# Patient Record
Sex: Male | Born: 1960 | Race: White | Hispanic: No | Marital: Married | Smoking: Former smoker
Health system: Southern US, Community
[De-identification: ages and names within clinical notes are randomized; demographics above are authoritative.]

## PROBLEM LIST (undated history)

## (undated) DIAGNOSIS — E119 Type 2 diabetes mellitus without complications: Secondary | ICD-10-CM

## (undated) DIAGNOSIS — Z720 Tobacco use: Secondary | ICD-10-CM

## (undated) DIAGNOSIS — E785 Hyperlipidemia, unspecified: Secondary | ICD-10-CM

## (undated) DIAGNOSIS — I10 Essential (primary) hypertension: Secondary | ICD-10-CM

## (undated) DIAGNOSIS — Z87442 Personal history of urinary calculi: Secondary | ICD-10-CM

## (undated) DIAGNOSIS — R918 Other nonspecific abnormal finding of lung field: Secondary | ICD-10-CM

## (undated) DIAGNOSIS — M1990 Unspecified osteoarthritis, unspecified site: Secondary | ICD-10-CM

## (undated) HISTORY — DX: Tobacco use: Z72.0

## (undated) HISTORY — DX: Type 2 diabetes mellitus without complications: E11.9

## (undated) HISTORY — DX: Hyperlipidemia, unspecified: E78.5

## (undated) HISTORY — DX: Other nonspecific abnormal finding of lung field: R91.8

## (undated) HISTORY — PX: KNEE CARTILAGE SURGERY: SHX688

## (undated) HISTORY — PX: ANKLE SURGERY: SHX546

## (undated) HISTORY — PX: BACK SURGERY: SHX140

---

## 1999-06-05 ENCOUNTER — Encounter: Payer: Self-pay | Admitting: *Deleted

## 1999-06-05 ENCOUNTER — Ambulatory Visit (HOSPITAL_COMMUNITY): Admission: RE | Admit: 1999-06-05 | Discharge: 1999-06-05 | Payer: Self-pay | Admitting: *Deleted

## 2002-06-12 ENCOUNTER — Encounter: Payer: Self-pay | Admitting: Neurosurgery

## 2002-06-14 ENCOUNTER — Ambulatory Visit (HOSPITAL_COMMUNITY): Admission: RE | Admit: 2002-06-14 | Discharge: 2002-06-14 | Payer: Self-pay | Admitting: Neurosurgery

## 2002-06-14 ENCOUNTER — Encounter: Payer: Self-pay | Admitting: Neurosurgery

## 2003-02-13 ENCOUNTER — Emergency Department (HOSPITAL_COMMUNITY): Admission: EM | Admit: 2003-02-13 | Discharge: 2003-02-13 | Payer: Self-pay | Admitting: Emergency Medicine

## 2013-01-11 DIAGNOSIS — M51369 Other intervertebral disc degeneration, lumbar region without mention of lumbar back pain or lower extremity pain: Secondary | ICD-10-CM | POA: Insufficient documentation

## 2013-01-11 DIAGNOSIS — M5136 Other intervertebral disc degeneration, lumbar region: Secondary | ICD-10-CM | POA: Insufficient documentation

## 2013-01-11 DIAGNOSIS — R7989 Other specified abnormal findings of blood chemistry: Secondary | ICD-10-CM | POA: Insufficient documentation

## 2013-11-12 DIAGNOSIS — Z1331 Encounter for screening for depression: Secondary | ICD-10-CM | POA: Insufficient documentation

## 2014-02-12 DIAGNOSIS — G5602 Carpal tunnel syndrome, left upper limb: Secondary | ICD-10-CM | POA: Insufficient documentation

## 2014-04-12 DIAGNOSIS — Z5320 Procedure and treatment not carried out because of patient's decision for unspecified reasons: Secondary | ICD-10-CM | POA: Insufficient documentation

## 2015-05-13 DIAGNOSIS — M19042 Primary osteoarthritis, left hand: Secondary | ICD-10-CM | POA: Insufficient documentation

## 2015-05-13 DIAGNOSIS — M19041 Primary osteoarthritis, right hand: Secondary | ICD-10-CM | POA: Insufficient documentation

## 2015-07-29 DIAGNOSIS — F32A Depression, unspecified: Secondary | ICD-10-CM | POA: Insufficient documentation

## 2016-02-16 DIAGNOSIS — E782 Mixed hyperlipidemia: Secondary | ICD-10-CM | POA: Insufficient documentation

## 2016-02-16 DIAGNOSIS — Z79899 Other long term (current) drug therapy: Secondary | ICD-10-CM | POA: Insufficient documentation

## 2016-07-19 DIAGNOSIS — S46002A Unspecified injury of muscle(s) and tendon(s) of the rotator cuff of left shoulder, initial encounter: Secondary | ICD-10-CM | POA: Insufficient documentation

## 2016-11-18 DIAGNOSIS — K760 Fatty (change of) liver, not elsewhere classified: Secondary | ICD-10-CM | POA: Insufficient documentation

## 2016-11-18 DIAGNOSIS — S2243XD Multiple fractures of ribs, bilateral, subsequent encounter for fracture with routine healing: Secondary | ICD-10-CM | POA: Insufficient documentation

## 2016-11-18 DIAGNOSIS — S2222XD Fracture of body of sternum, subsequent encounter for fracture with routine healing: Secondary | ICD-10-CM | POA: Insufficient documentation

## 2016-11-18 DIAGNOSIS — S46001A Unspecified injury of muscle(s) and tendon(s) of the rotator cuff of right shoulder, initial encounter: Secondary | ICD-10-CM | POA: Insufficient documentation

## 2016-12-09 DIAGNOSIS — G5692 Unspecified mononeuropathy of left upper limb: Secondary | ICD-10-CM | POA: Insufficient documentation

## 2017-11-07 DIAGNOSIS — E1169 Type 2 diabetes mellitus with other specified complication: Secondary | ICD-10-CM | POA: Insufficient documentation

## 2018-04-06 DIAGNOSIS — R3912 Poor urinary stream: Secondary | ICD-10-CM | POA: Insufficient documentation

## 2018-04-06 DIAGNOSIS — N401 Enlarged prostate with lower urinary tract symptoms: Secondary | ICD-10-CM | POA: Insufficient documentation

## 2018-12-14 DIAGNOSIS — M1712 Unilateral primary osteoarthritis, left knee: Secondary | ICD-10-CM | POA: Insufficient documentation

## 2020-08-02 DIAGNOSIS — R911 Solitary pulmonary nodule: Secondary | ICD-10-CM | POA: Insufficient documentation

## 2020-08-28 DIAGNOSIS — S68624A Partial traumatic transphalangeal amputation of right ring finger, initial encounter: Secondary | ICD-10-CM | POA: Insufficient documentation

## 2021-02-17 ENCOUNTER — Other Ambulatory Visit: Payer: Self-pay | Admitting: Surgery

## 2021-02-17 DIAGNOSIS — Z951 Presence of aortocoronary bypass graft: Secondary | ICD-10-CM

## 2021-03-05 ENCOUNTER — Institutional Professional Consult (permissible substitution): Payer: BC Managed Care – PPO | Admitting: Thoracic Surgery (Cardiothoracic Vascular Surgery)

## 2021-03-05 ENCOUNTER — Other Ambulatory Visit: Payer: Self-pay

## 2021-03-05 ENCOUNTER — Encounter: Payer: Self-pay | Admitting: Thoracic Surgery (Cardiothoracic Vascular Surgery)

## 2021-03-05 VITALS — BP 114/71 | HR 78 | Resp 20 | Ht 70.5 in | Wt 186.7 lb

## 2021-03-05 DIAGNOSIS — N486 Induration penis plastica: Secondary | ICD-10-CM | POA: Insufficient documentation

## 2021-03-05 DIAGNOSIS — Z720 Tobacco use: Secondary | ICD-10-CM | POA: Insufficient documentation

## 2021-03-05 DIAGNOSIS — R911 Solitary pulmonary nodule: Secondary | ICD-10-CM | POA: Diagnosis not present

## 2021-03-05 NOTE — H&P (View-Only) (Signed)
PCP is No primary care provider on file. Referring Provider is Delma Officer, - Pulmonology  Chief Complaint  Patient presents with   Lung Mass    Surgical consult, PET Scan 02/05/21, Chest CT 11/19/20    HPI: Bill [name] is sent for consultation regarding a right lower lobe lung nodule.  Bill [name] is a 60 year old man with history of tobacco abuse, chronic pain, multiple orthopedic injuries and surgeries, type 2 diabetes, hyperlipidemia, neuropathy, anxiety, and depression.  He is being evaluated for possible kidney stone back in the spring.  A CT showed a mixed density right lower lobe lung lesion.  It measured 3.6 x 3cm.  It was near the posterior right costophrenic sulcus.  Follow-up CT in May showed no change.  More recently he had a PET/CT which showed the nodule was hypermetabolic.  There was no evidence of regional or distant metastatic disease.  He is on gabapentin and methadone for pain.  He does have a pain contract with his primary care.  He had an open sternal fracture repaired in 2018.  He does still have some pain in that area.  He relates an episode about a month ago when he was mowing his grass when he had severe pain in his chest.  He also felt general malaise and extreme fatigue.  He attributed the pain to his sternal injury.  He has not had any further episodes of that nature.  He complains of dizzy spells if he bends over and then stands back up.  He denies shortness of breath, coughing, and wheezing.  Zubrod Score: At the time of surgery this patient's most appropriate activity status/level should be described as: []     0    Normal activity, no symptoms [x]     1    Restricted in physical strenuous activity but ambulatory, able to do out light work []     2    Ambulatory and capable of self care, unable to do work activities, up and about >50 % of waking hours                              []     3    Only limited self care, in bed greater than 50% of  waking hours []     4    Completely disabled, no self care, confined to bed or chair []     5    Moribund   Patient Active Problem List   Diagnosis Date Noted   Peyronie disease 03/05/2021   Tobacco use 03/05/2021   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   Lung nodule 08/02/2020   Primary osteoarthritis of left knee 12/14/2018   Benign prostatic hyperplasia with weak urinary stream 04/06/2018   Type 2 diabetes mellitus with hyperlipidemia ([place]) 11/07/2017   Neuropathy, arm, left 12/09/2016   Closed fracture of body of sternum with routine healing 11/18/2016   Hepatic steatosis 11/18/2016   Injury of right rotator cuff 11/18/2016   Multiple closed fractures of ribs of both sides with routine healing 11/18/2016   Injury of left rotator cuff 07/19/2016   Controlled substance agreement signed 02/16/2016   Mixed hyperlipidemia 02/16/2016   Anxiety and depression 07/29/2015   Arthritis of both hands 05/13/2015   Colonoscopy refused 04/12/2014   Carpal tunnel syndrome of left wrist 02/12/2014   Screening for depression 11/12/2013   DDD (degenerative disc disease), lumbar 01/11/2013  Low testosterone 01/11/2013     History reviewed. No pertinent surgical history.  History reviewed. No pertinent family history.  Social History Social History   Tobacco Use   Smoking status: Every Day    Packs/day: 0.50    Types: Cigarettes   Smokeless tobacco: Never    Current Outpatient Medications  Medication Sig Dispense Refill   aspirin 81 MG EC tablet Take by mouth.     atorvastatin (LIPITOR) 40 MG tablet Take 1 tablet by mouth daily.     azelastine (ASTELIN) 0.1 % nasal spray 1-2 sprays into each nostril two (2) times a day as needed. Use in each nostril as directed     DULoxetine (CYMBALTA) 30 MG capsule Take by mouth.     gabapentin (NEURONTIN) 400 MG capsule Take by mouth.     lisinopril (ZESTRIL) 10 MG tablet Take by mouth.     Melatonin 5 MG  CAPS Take one capsule by mouth once nightly     meloxicam (MOBIC) 15 MG tablet Take 1 tablet by mouth daily.     metFORMIN (GLUCOPHAGE-XR) 500 MG 24 hr tablet Take by mouth.     methadone (DOLOPHINE) 10 MG tablet Take by mouth.     tadalafil (CIALIS) 5 MG tablet Take 1 tablet by mouth daily.     traMADol (ULTRAM) 50 MG tablet Take 1-2 tablets by mouth every 6 (six) hours as needed.     No current facility-administered medications for this visit.    Allergies  Allergen Reactions   Penicillins Other (See Comments)    Unknown but was told as child has reaction    Review of Systems  Constitutional:  Positive for fatigue. Negative for activity change.  HENT:  Negative for trouble swallowing and voice change.   Eyes:  Negative for visual disturbance.  Respiratory:  Negative for cough, shortness of breath and wheezing.   Cardiovascular:  Positive for chest pain.  Musculoskeletal:  Positive for arthralgias, back pain and joint swelling.  Neurological:  Positive for dizziness and numbness.  All other systems reviewed and are negative.  BP 114/71 (BP Location: Left Arm, Patient Position: Sitting, Cuff Size: Normal)   Pulse 78   Resp 20   Ht 5' 10.5" (1.791 m)   Wt 186 lb 11.2 oz (84.7 kg)   SpO2 99% Comment: RA  BMI 26.41 kg/m  Physical Exam Vitals reviewed.  Constitutional:      General: He is not in acute distress.    Appearance: Normal appearance.  HENT:     Head: Normocephalic and atraumatic.  Eyes:     General: No scleral icterus.    Extraocular Movements: Extraocular movements intact.  Cardiovascular:     Rate and Rhythm: Normal rate and regular rhythm.     Heart sounds: Normal heart sounds. No murmur heard. Pulmonary:     Effort: Pulmonary effort is normal. No respiratory distress.     Breath sounds: Normal breath sounds. No wheezing or rales.  Abdominal:     General: There is no distension.     Palpations: Abdomen is soft.     Tenderness: There is no abdominal  tenderness.  Skin:    General: Skin is warm and dry.  Neurological:     General: No focal deficit present.     Mental Status: He is oriented to person, place, and time.     Cranial Nerves: No cranial nerve deficit.     Motor: No weakness.    Diagnostic Tests: I personally reviewed  the CT and PET/CT images.  The patient brought with him on a disc.  There is a 3.4 x 3 cm mixed density lesion in the inferior basilar posterior right lower lobe.  On PET/CT there is hypermetabolism.  There is no evidence of regional or distant metastatic disease.  There is severe coronary calcification.  Pulmonary function testing per Dr. Georgetta Haber office note FVC 101.6% FEV1 112% DLCO 100%  Impression: Issaac Shipper is a 60 year old man from [facility] with a history of tobacco abuse, chronic pain, multiple orthopedic injuries and surgeries, type 2 diabetes, hyperlipidemia, neuropathy, kidney stones, anxiety, and depression.    He was incidentally found to have a right lower lobe lung nodule on a CT done to evaluate for kidney stones back in February.  In May the nodule was persistent.  He then had a PET/CT on 02/05/2021 which showed the nodule was hypermetabolic.  Findings are concerning for a new primary bronchogenic carcinoma.  Infectious and inflammatory nodules are also in the differential, but this has to be considered a lung cancer unless proven otherwise.  Clinically this would be a stage Ia (T1, N0 lesion)  We discussed the options of attempting to do a bronchoscopic or needle biopsy.  Unfortunately with the location near the diaphragm with either those being negative, I would not trust that result and would recommend surgical resection regardless.  Obviously, if positive he would need surgical resection.  I recommended we proceed directly to robotic right VATS for wedge resection for definitive diagnosis and possible lower lobectomy if it is in fact cancerous.  I discussed the general nature of the  procedure including the need for general anesthesia, the incisions to be used, the intraoperative decision-making, the use of a drainage tube postoperatively, the expected hospital stay, and the overall recovery with Mr. and Mrs. Nilsen.  I informed them of the indications, risks, benefits, and alternatives.  They understand the risks include, but not limited to death, MI, DVT, PE, bleeding, possible need for transfusion, infection, prolonged air leak, cardiac arrhythmias, as well as possibility of other unforeseeable complications.  He understands and accepts the risks and wishes to proceed with surgical resection for definitive diagnosis and treatment.  I am a little concerned about his cardiac status.  He has extensive coronary calcification on his CT and PET/CT images.  He had this episode about a month ago while mowing the yard.  Initially sound like it might just be musculoskeletal, but on further questioning he had some additional symptoms including extreme fatigue and malaise that are concerning for possible cardiac event.  I think he should be evaluated by cardiology prior to undergoing a major operation.  Tobacco abuse he says he smoked about half a pack a day for 40 years.  He is still smoking.  He says he has quit several times.  Emphasized the importance of complete cessation.  Chronic pain syndrome-chronically on gabapentin and methadone.  He has a Soil scientist with his primary.  The plan would be we would manage his pain while in the hospital and then defer to his primary for outpatient pain management.  Plan: Obtain full pulmonary function test results Cardiology evaluation for preoperative clearance Once cleared by cardiology plan for robotic right VATS for wedge resection and possible right lower lobectomy.  [name], MD [facility] [phone]

## 2021-03-05 NOTE — Progress Notes (Signed)
PCP is No primary care provider on file. Referring Provider is Delma Officer, - Pulmonology  Chief Complaint  Patient presents with   Lung Mass    Surgical consult, PET Scan 02/05/21, Chest CT 11/19/20    HPI: Bill [name] is sent for consultation regarding a right lower lobe lung nodule.  Bill [name] is a 60 year old man with history of tobacco abuse, chronic pain, multiple orthopedic injuries and surgeries, type 2 diabetes, hyperlipidemia, neuropathy, anxiety, and depression.  He is being evaluated for possible kidney stone back in the spring.  A CT showed a mixed density right lower lobe lung lesion.  It measured 3.6 x 3cm.  It was near the posterior right costophrenic sulcus.  Follow-up CT in May showed no change.  More recently he had a PET/CT which showed the nodule was hypermetabolic.  There was no evidence of regional or distant metastatic disease.  He is on gabapentin and methadone for pain.  He does have a pain contract with his primary care.  He had an open sternal fracture repaired in 2018.  He does still have some pain in that area.  He relates an episode about a month ago when he was mowing his grass when he had severe pain in his chest.  He also felt general malaise and extreme fatigue.  He attributed the pain to his sternal injury.  He has not had any further episodes of that nature.  He complains of dizzy spells if he bends over and then stands back up.  He denies shortness of breath, coughing, and wheezing.  Zubrod Score: At the time of surgery this patient's most appropriate activity status/level should be described as: []     0    Normal activity, no symptoms [x]     1    Restricted in physical strenuous activity but ambulatory, able to do out light work []     2    Ambulatory and capable of self care, unable to do work activities, up and about >50 % of waking hours                              []     3    Only limited self care, in bed greater than 50% of  waking hours []     4    Completely disabled, no self care, confined to bed or chair []     5    Moribund   Patient Active Problem List   Diagnosis Date Noted   Peyronie disease 03/05/2021   Tobacco use 03/05/2021   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   Lung nodule 08/02/2020   Primary osteoarthritis of left knee 12/14/2018   Benign prostatic hyperplasia with weak urinary stream 04/06/2018   Type 2 diabetes mellitus with hyperlipidemia ([place]) 11/07/2017   Neuropathy, arm, left 12/09/2016   Closed fracture of body of sternum with routine healing 11/18/2016   Hepatic steatosis 11/18/2016   Injury of right rotator cuff 11/18/2016   Multiple closed fractures of ribs of both sides with routine healing 11/18/2016   Injury of left rotator cuff 07/19/2016   Controlled substance agreement signed 02/16/2016   Mixed hyperlipidemia 02/16/2016   Anxiety and depression 07/29/2015   Arthritis of both hands 05/13/2015   Colonoscopy refused 04/12/2014   Carpal tunnel syndrome of left wrist 02/12/2014   Screening for depression 11/12/2013   DDD (degenerative disc disease), lumbar 01/11/2013  Low testosterone 01/11/2013     History reviewed. No pertinent surgical history.  History reviewed. No pertinent family history.  Social History Social History   Tobacco Use   Smoking status: Every Day    Packs/day: 0.50    Types: Cigarettes   Smokeless tobacco: Never    Current Outpatient Medications  Medication Sig Dispense Refill   aspirin 81 MG EC tablet Take by mouth.     atorvastatin (LIPITOR) 40 MG tablet Take 1 tablet by mouth daily.     azelastine (ASTELIN) 0.1 % nasal spray 1-2 sprays into each nostril two (2) times a day as needed. Use in each nostril as directed     DULoxetine (CYMBALTA) 30 MG capsule Take by mouth.     gabapentin (NEURONTIN) 400 MG capsule Take by mouth.     lisinopril (ZESTRIL) 10 MG tablet Take by mouth.     Melatonin 5 MG  CAPS Take one capsule by mouth once nightly     meloxicam (MOBIC) 15 MG tablet Take 1 tablet by mouth daily.     metFORMIN (GLUCOPHAGE-XR) 500 MG 24 hr tablet Take by mouth.     methadone (DOLOPHINE) 10 MG tablet Take by mouth.     tadalafil (CIALIS) 5 MG tablet Take 1 tablet by mouth daily.     traMADol (ULTRAM) 50 MG tablet Take 1-2 tablets by mouth every 6 (six) hours as needed.     No current facility-administered medications for this visit.    Allergies  Allergen Reactions   Penicillins Other (See Comments)    Unknown but was told as child has reaction    Review of Systems  Constitutional:  Positive for fatigue. Negative for activity change.  HENT:  Negative for trouble swallowing and voice change.   Eyes:  Negative for visual disturbance.  Respiratory:  Negative for cough, shortness of breath and wheezing.   Cardiovascular:  Positive for chest pain.  Musculoskeletal:  Positive for arthralgias, back pain and joint swelling.  Neurological:  Positive for dizziness and numbness.  All other systems reviewed and are negative.  BP 114/71 (BP Location: Left Arm, Patient Position: Sitting, Cuff Size: Normal)   Pulse 78   Resp 20   Ht 5' 10.5" (1.791 m)   Wt 186 lb 11.2 oz (84.7 kg)   SpO2 99% Comment: RA  BMI 26.41 kg/m  Physical Exam Vitals reviewed.  Constitutional:      General: He is not in acute distress.    Appearance: Normal appearance.  HENT:     Head: Normocephalic and atraumatic.  Eyes:     General: No scleral icterus.    Extraocular Movements: Extraocular movements intact.  Cardiovascular:     Rate and Rhythm: Normal rate and regular rhythm.     Heart sounds: Normal heart sounds. No murmur heard. Pulmonary:     Effort: Pulmonary effort is normal. No respiratory distress.     Breath sounds: Normal breath sounds. No wheezing or rales.  Abdominal:     General: There is no distension.     Palpations: Abdomen is soft.     Tenderness: There is no abdominal  tenderness.  Skin:    General: Skin is warm and dry.  Neurological:     General: No focal deficit present.     Mental Status: He is oriented to person, place, and time.     Cranial Nerves: No cranial nerve deficit.     Motor: No weakness.    Diagnostic Tests: I personally reviewed  the CT and PET/CT images.  The patient brought with him on a disc.  There is a 3.4 x 3 cm mixed density lesion in the inferior basilar posterior right lower lobe.  On PET/CT there is hypermetabolism.  There is no evidence of regional or distant metastatic disease.  There is severe coronary calcification.  Pulmonary function testing per Dr. Georgetta Haber office note FVC 101.6% FEV1 112% DLCO 100%  Impression: Bill [name] is a 60 year old man from [facility] with a history of tobacco abuse, chronic pain, multiple orthopedic injuries and surgeries, type 2 diabetes, hyperlipidemia, neuropathy, kidney stones, anxiety, and depression.    He was incidentally found to have a right lower lobe lung nodule on a CT done to evaluate for kidney stones back in February.  In May the nodule was persistent.  He then had a PET/CT on 02/05/2021 which showed the nodule was hypermetabolic.  Findings are concerning for a new primary bronchogenic carcinoma.  Infectious and inflammatory nodules are also in the differential, but this has to be considered a lung cancer unless proven otherwise.  Clinically this would be a stage Ia (T1, N0 lesion)  We discussed the options of attempting to do a bronchoscopic or needle biopsy.  Unfortunately with the location near the diaphragm with either those being negative, I would not trust that result and would recommend surgical resection regardless.  Obviously, if positive he would need surgical resection.  I recommended we proceed directly to robotic right VATS for wedge resection for definitive diagnosis and possible lower lobectomy if it is in fact cancerous.  I discussed the general nature of the  procedure including the need for general anesthesia, the incisions to be used, the intraoperative decision-making, the use of a drainage tube postoperatively, the expected hospital stay, and the overall recovery with Mr. and Mrs. Dresser.  I informed them of the indications, risks, benefits, and alternatives.  They understand the risks include, but not limited to death, MI, DVT, PE, bleeding, possible need for transfusion, infection, prolonged air leak, cardiac arrhythmias, as well as possibility of other unforeseeable complications.  He understands and accepts the risks and wishes to proceed with surgical resection for definitive diagnosis and treatment.  I am a little concerned about his cardiac status.  He has extensive coronary calcification on his CT and PET/CT images.  He had this episode about a month ago while mowing the yard.  Initially sound like it might just be musculoskeletal, but on further questioning he had some additional symptoms including extreme fatigue and malaise that are concerning for possible cardiac event.  I think he should be evaluated by cardiology prior to undergoing a major operation.  Tobacco abuse he says he smoked about half a pack a day for 40 years.  He is still smoking.  He says he has quit several times.  Emphasized the importance of complete cessation.  Chronic pain syndrome-chronically on gabapentin and methadone.  He has a Soil scientist with his primary.  The plan would be we would manage his pain while in the hospital and then defer to his primary for outpatient pain management.  Plan: Obtain full pulmonary function test results Cardiology evaluation for preoperative clearance Once cleared by cardiology plan for robotic right VATS for wedge resection and possible right lower lobectomy.  [name], MD [facility] [phone]

## 2021-03-06 ENCOUNTER — Other Ambulatory Visit: Payer: Self-pay | Admitting: Thoracic Surgery (Cardiothoracic Vascular Surgery)

## 2021-03-06 DIAGNOSIS — R911 Solitary pulmonary nodule: Secondary | ICD-10-CM

## 2021-03-10 LAB — PULMONARY FUNCTION TEST

## 2021-03-11 ENCOUNTER — Ambulatory Visit: Payer: BC Managed Care – PPO | Admitting: Cardiovascular Disease

## 2021-03-11 ENCOUNTER — Other Ambulatory Visit: Payer: Self-pay

## 2021-03-11 ENCOUNTER — Encounter: Payer: Self-pay | Admitting: *Deleted

## 2021-03-11 ENCOUNTER — Encounter: Payer: Self-pay | Admitting: Cardiovascular Disease

## 2021-03-11 VITALS — BP 118/70 | HR 73 | Ht 70.5 in | Wt 187.6 lb

## 2021-03-11 DIAGNOSIS — Z01818 Encounter for other preprocedural examination: Secondary | ICD-10-CM

## 2021-03-11 DIAGNOSIS — R079 Chest pain, unspecified: Secondary | ICD-10-CM | POA: Diagnosis not present

## 2021-03-11 NOTE — Patient Instructions (Signed)
Medication Instructions:  No changes *If you need a refill on your cardiac medications before your next appointment, please call your pharmacy*   Lab Work: none If you have labs (blood work) drawn today and your tests are completely normal, you will receive your results only by: [place] (if you have MyChart) OR A paper copy in the mail If you have any lab test that is abnormal or we need to change your treatment, we will call you to review the results.   Testing/Procedures: Your physician has requested that you have an echocardiogram. Echocardiography is a painless test that uses sound waves to create images of your heart. It provides your doctor with information about the size and shape of your heart and how well your heart's chambers and valves are working. This procedure takes approximately one hour. There are no restrictions for this procedure.  Your physician has requested that you have en exercise stress myoview. For further information please visit [email or web address]. Please follow instruction sheet, as given.   Follow-Up: Follow up with your physician will depend on test results.

## 2021-03-11 NOTE — Progress Notes (Signed)
Chief Complaint  Patient presents with   New Patient (Initial Visit)    Chest pain    History of Present Illness: 60 yo male with history of tobacco abuse, chronic pain, DM, HLD, anxiety, neuropathy and a new lung mass who is here today for cardiac evaluation prior to resection of his lung mass. There is extensive coronary artery calcification on his chest CT. He is a long term smoker. New right lower lobe lung mass. He has seen Dr. Roxan Hockey with CT surgery and is referred to our office today for cardiac risk assessment. He had one episode of chest pain while he was pushing a lawnmower. He has a sternal plate. No dyspnea or LE edema. He does have dizziness when he bends down.   Primary Care Physician: [facility])  Past Medical History:  Diagnosis Date   Diabetes mellitus ([place])    Hyperlipidemia    Lung mass    Tobacco abuse     Past Surgical History:  Procedure Laterality Date   ANKLE SURGERY     BACK SURGERY     KNEE CARTILAGE SURGERY      Current Outpatient Medications  Medication Sig Dispense Refill   aspirin 81 MG EC tablet Take by mouth.     atorvastatin (LIPITOR) 40 MG tablet Take 1 tablet by mouth daily.     DULoxetine (CYMBALTA) 30 MG capsule Take by mouth.     gabapentin (NEURONTIN) 400 MG capsule Take by mouth.     lisinopril (ZESTRIL) 10 MG tablet Take by mouth.     Melatonin 5 MG CAPS Take one capsule by mouth once nightly     meloxicam (MOBIC) 15 MG tablet Take 1 tablet by mouth daily.     metFORMIN (GLUCOPHAGE-XR) 500 MG 24 hr tablet Take by mouth.     methadone (DOLOPHINE) 10 MG tablet Take by mouth.     tadalafil (CIALIS) 5 MG tablet Take 1 tablet by mouth daily.     traMADol (ULTRAM) 50 MG tablet Take 1-2 tablets by mouth every 6 (six) hours as needed.     azelastine (ASTELIN) 0.1 % nasal spray 1-2 sprays into each nostril two (2) times a day as needed. Use in each nostril as directed (Patient not taking: Reported on 03/11/2021)     No current  facility-administered medications for this visit.    Allergies  Allergen Reactions   Penicillins Other (See Comments)    Unknown but was told as child has reaction    Social History   Socioeconomic History   Marital status: Married    Spouse name: Not on file   Number of children: 2   Years of education: Not on file   Highest education level: Not on file  Occupational History   Occupation: Metal fabricator  Tobacco Use   Smoking status: Every Day    Packs/day: 0.50    Types: Cigarettes   Smokeless tobacco: Never  Substance and Sexual Activity   Alcohol use: Not Currently   Drug use: Never   Sexual activity: Not on file  Other Topics Concern   Not on file  Social History Narrative   Not on file   Social Determinants of Health   Financial Resource Strain: Not on file  Food Insecurity: Not on file  Transportation Needs: Not on file  Physical Activity: Not on file  Stress: Not on file  Social Connections: Not on file  Intimate Partner Violence: Not on file    Family History  Problem Relation Age of Onset   CVA Mother    Pneumonia Father    Alzheimer's disease Father     Review of Systems:  As stated in the HPI and otherwise negative.   BP 118/70   Pulse 73   Ht 5' 10.5" (1.791 m)   Wt 187 lb 9.6 oz (85.1 kg)   SpO2 98%   BMI 26.54 kg/m   Physical Examination: General: Well developed, well nourished, NAD  HEENT: OP clear, mucus membranes moist  SKIN: warm, dry. No rashes. Neuro: No focal deficits  Musculoskeletal: Muscle strength 5/5 all ext  Psychiatric: Mood and affect normal  Neck: No JVD, no carotid bruits, no thyromegaly, no lymphadenopathy.  Lungs:Clear bilaterally, no wheezes, rhonci, crackles Cardiovascular: Regular rate and rhythm. No murmurs, gallops or rubs. Abdomen:Soft. Bowel sounds present. Non-tender.  Extremities: No lower extremity edema. Pulses are 2 + in the bilateral DP/PT.  EKG:  EKG is ordered today. The ekg ordered today  demonstrates Sinus  Recent Labs: No results found for requested labs within last 8760 hours.   Lipid Panel No results found for: CHOL, TRIG, HDL, CHOLHDL, VLDL, LDLCALC, LDLDIRECT   Wt Readings from Last 3 Encounters:  03/11/21 187 lb 9.6 oz (85.1 kg)  03/05/21 186 lb 11.2 oz (84.7 kg)     Assessment and Plan:   1. Pre-operative cardiovascular examination: He has an upcoming thoracic surgery for lung mass resection. He has had one episode of chest pain while mowing grass that he attributes to his chronic chest wall pain. Overall he is very functional and is not limited in any way by chest pain or dyspnea. Risk factors for CAD include tobacco abuse, hyperlipidemia and DM.  -I will arrange an exercise nuclear stress test and an echo -Further plans to follow after cardiac testing. No murmurs on exam so if LVEF normal on nuclear study, his echo could be done after his lung resection.   Current medicines are reviewed at length with the patient today.  The patient does not have concerns regarding medicines.  The following changes have been made:  no change  Labs/ tests ordered today include:   Orders Placed This Encounter  Procedures   Cardiac Stress Test: Informed Consent Details: Physician/Practitioner Attestation; Transcribe to consent form and obtain patient signature   MYOCARDIAL PERFUSION IMAGING   EKG 12-Lead   ECHOCARDIOGRAM COMPLETE      Disposition:   F/U with me in 6 months    Signed, Lauree Chandler, MD 03/11/2021 4:07 PM    [facility] [address], [place], [place]  [address] Phone: [phone]; Fax: [phone]

## 2021-03-12 ENCOUNTER — Telehealth (HOSPITAL_COMMUNITY): Payer: Self-pay | Admitting: *Deleted

## 2021-03-12 NOTE — Telephone Encounter (Signed)
Patient's wife given detailed instructions per Myocardial Perfusion Study Information Sheet for the test on 03/16/21 at 7:45. Patient notified to arrive 15 minutes early and that it is imperative to arrive on time for appointment to keep from having the test rescheduled.  If you need to cancel or reschedule your appointment, please call the office within 24 hours of your appointment. . Patient's wife verbalized understanding.Veronia Beets

## 2021-03-16 ENCOUNTER — Ambulatory Visit (HOSPITAL_COMMUNITY): Payer: BC Managed Care – PPO | Attending: Cardiovascular Disease

## 2021-03-16 ENCOUNTER — Other Ambulatory Visit: Payer: Self-pay

## 2021-03-16 DIAGNOSIS — Z01810 Encounter for preprocedural cardiovascular examination: Secondary | ICD-10-CM

## 2021-03-16 DIAGNOSIS — Z01818 Encounter for other preprocedural examination: Secondary | ICD-10-CM | POA: Insufficient documentation

## 2021-03-16 DIAGNOSIS — R079 Chest pain, unspecified: Secondary | ICD-10-CM | POA: Insufficient documentation

## 2021-03-16 LAB — MYOCARDIAL PERFUSION IMAGING
Angina Index: 0
Duke Treadmill Score: 10
Estimated workload: 10.9
Exercise duration (min): 9 min
Exercise duration (sec): 30 s
LV dias vol: 96 mL (ref 62–150)
LV sys vol: 36 mL
MPHR: 160 {beats}/min
Nuc Stress EF: 63 %
Peak HR: 144 {beats}/min
Percent HR: 90 %
Rest HR: 83 {beats}/min
Rest Nuclear Isotope Dose: 10.1 mCi
SDS: 0
SRS: 0
SSS: 0
ST Depression (mm): 0 mm
Stress Nuclear Isotope Dose: 32.4 mCi
TID: 0.92

## 2021-03-16 LAB — PULMONARY FUNCTION TEST

## 2021-03-16 MED ORDER — TECHNETIUM TC 99M TETROFOSMIN IV KIT
10.1000 | PACK | Freq: Once | INTRAVENOUS | Status: AC | PRN
  Administered 2021-03-16: 10.1 via INTRAVENOUS
  Filled 2021-03-16: qty 11

## 2021-03-16 MED ORDER — TECHNETIUM TC 99M TETROFOSMIN IV KIT
32.4000 | PACK | Freq: Once | INTRAVENOUS | Status: AC | PRN
  Administered 2021-03-16: 32.4 via INTRAVENOUS
  Filled 2021-03-16: qty 33

## 2021-03-18 ENCOUNTER — Telehealth: Payer: Self-pay | Admitting: *Deleted

## 2021-03-18 ENCOUNTER — Other Ambulatory Visit: Payer: Self-pay | Admitting: *Deleted

## 2021-03-18 ENCOUNTER — Encounter: Payer: Self-pay | Admitting: *Deleted

## 2021-03-18 DIAGNOSIS — R911 Solitary pulmonary nodule: Secondary | ICD-10-CM

## 2021-03-18 NOTE — Telephone Encounter (Signed)
-----   Message from Nuala Alpha, LPN sent at 8/33/3832 10:18 AM EDT -----  ----- Message ----- From: Damian Leavell, RN Sent: 03/18/2021  10:12 AM EDT To: Rebeca Alert [facility] Triage   ----- Message ----- From: Laury Deep, RN Sent: 03/16/2021   2:03 PM EDT To: Camelia Phenes, #  This patient needs full PFTs per Dr. Roxan Hockey.  No surgery date yet but can someone schedule PFTS please?  Maybe same day as his Echo (9/30??)  Also he will need a COVID swab pre-op but since he lives in [place], [place] can COVID him (their number to schedule is [phone]).  Thanks, [employer]

## 2021-03-18 NOTE — Telephone Encounter (Signed)
PFTs from June 2022 in chart reviewed by Dr. Roxan Hockey on 9/12.

## 2021-03-27 ENCOUNTER — Ambulatory Visit (HOSPITAL_COMMUNITY): Payer: BC Managed Care – PPO | Attending: Cardiology

## 2021-03-27 ENCOUNTER — Other Ambulatory Visit: Payer: Self-pay

## 2021-03-27 DIAGNOSIS — Z01818 Encounter for other preprocedural examination: Secondary | ICD-10-CM | POA: Diagnosis present

## 2021-03-27 DIAGNOSIS — R079 Chest pain, unspecified: Secondary | ICD-10-CM | POA: Diagnosis not present

## 2021-03-27 DIAGNOSIS — Z01810 Encounter for preprocedural cardiovascular examination: Secondary | ICD-10-CM | POA: Diagnosis not present

## 2021-03-27 NOTE — Progress Notes (Signed)
Surgical Instructions    Your procedure is scheduled on Wednesday, October 5th, 2022.   Report to [facility] Main Entrance "A" at 06:30 A.M., then check in with the Admitting office.  Call this number if you have problems the morning of surgery:  [phone]   If you have any questions prior to your surgery date call [phone]: Open Monday-Friday 8am-4pm    Remember:  Do not eat or drink after midnight the night before your surgery    Take these medicines the morning of surgery with A SIP OF WATER:  atorvastatin (LIPITOR)  DULoxetine (CYMBALTA) gabapentin (NEURONTIN)  methadone (DOLOPHINE)   If needed:  traMADol (ULTRAM)   Follow your surgeon's instructions on when to stop Aspirin.  If no instructions were given by your surgeon then you will need to call the office to get those instructions.     As of today, STOP taking any Aspirin (unless otherwise instructed by your surgeon) Aleve, Naproxen, Ibuprofen, Motrin, Advil, Goody's, BC's, all herbal medications, fish oil, and all vitamins.           WHAT DO I DO ABOUT MY DIABETES MEDICATION?   Do not metFORMIN (GLUCOPHAGE-XR)  the morning of surgery.   HOW TO MANAGE YOUR DIABETES BEFORE AND AFTER SURGERY  Why is it important to control my blood sugar before and after surgery? Improving blood sugar levels before and after surgery helps healing and can limit problems. A way of improving blood sugar control is eating a healthy diet by:  Eating less sugar and carbohydrates  Increasing activity/exercise  Talking with your doctor about reaching your blood sugar goals High blood sugars (greater than 180 mg/dL) can raise your risk of infections and slow your recovery, so you will need to focus on controlling your diabetes during the weeks before surgery. Make sure that the doctor who takes care of your diabetes knows about your planned surgery including the date and location.  How do I manage my blood sugar before  surgery? Check your blood sugar at least 4 times a day, starting 2 days before surgery, to make sure that the level is not too high or low.  Check your blood sugar the morning of your surgery when you wake up and every 2 hours until you get to the Short Stay unit.  If your blood sugar is less than 70 mg/dL, you will need to treat for low blood sugar: Do not take insulin. Treat a low blood sugar (less than 70 mg/dL) with  cup of clear juice (cranberry or apple), 4 glucose tablets, OR glucose gel. Recheck blood sugar in 15 minutes after treatment (to make sure it is greater than 70 mg/dL). If your blood sugar is not greater than 70 mg/dL on recheck, call [phone] for further instructions. Report your blood sugar to the short stay nurse when you get to Short Stay.  If you are admitted to the hospital after surgery: Your blood sugar will be checked by the staff and you will probably be given insulin after surgery (instead of oral diabetes medicines) to make sure you have good blood sugar levels. The goal for blood sugar control after surgery is 80-180 mg/dL.    After your COVID test   You are not required to quarantine however you are required to wear a well-fitting mask when you are out and around people not in your household.  If your mask becomes wet or soiled, replace with a new one.  Wash your hands often with soap  and water for 20 seconds or clean your hands with an alcohol-based hand sanitizer that contains at least 60% alcohol.  Do not share personal items.  Notify your provider: if you are in close contact with someone who has COVID  or if you develop a fever of 100.4 or greater, sneezing, cough, sore throat, shortness of breath or body aches.   Do not wear jewelry  Do not wear lotions, powders, colognes, or deodorant. Men may shave face and neck. Do not bring valuables to the hospital.                 [facility] is not responsible for any belongings or valuables.  Do  NOT Smoke (Tobacco/Vaping)  24 hours prior to your procedure If you use a CPAP at night, you may bring your mask for your overnight stay.   Contacts, glasses, dentures or bridgework may not be worn into surgery, please bring cases for these belongings   For patients admitted to the hospital, discharge time will be determined by your treatment team.   Patients discharged the day of surgery will not be allowed to drive home, and someone needs to stay with them for 24 hours.  NO VISITORS WILL BE ALLOWED IN PRE-OP WHERE PATIENTS ARE PREPPED FOR SURGERY.  ONLY 1 SUPPORT PERSON MAY BE PRESENT IN THE WAITING ROOM WHILE YOU ARE IN SURGERY.  IF YOU ARE TO BE ADMITTED, ONCE YOU ARE IN YOUR ROOM YOU WILL BE ALLOWED TWO (2) VISITORS. 1 (ONE) VISITOR MAY STAY OVERNIGHT BUT MUST ARRIVE TO THE ROOM BY 8pm.  Minor children may have two parents present. Special consideration for safety and communication needs will be reviewed on a case by case basis.  Special instructions:    Oral Hygiene is also important to reduce your risk of infection.  Remember - BRUSH YOUR TEETH THE MORNING OF SURGERY WITH YOUR REGULAR TOOTHPASTE   [facility]- Preparing For Surgery  Before surgery, you can play an important role. Because skin is not sterile, your skin needs to be as free of germs as possible. You can reduce the number of germs on your skin by washing with CHG (chlorahexidine gluconate) Soap before surgery.  CHG is an antiseptic cleaner which kills germs and bonds with the skin to continue killing germs even after washing.     Please do not use if you have an allergy to CHG or antibacterial soaps. If your skin becomes reddened/irritated stop using the CHG.  Do not shave (including legs and underarms) for at least 48 hours prior to first CHG shower. It is OK to shave your face.  Please follow these instructions carefully.     Shower the NIGHT BEFORE SURGERY and the MORNING OF SURGERY with CHG Soap.   If you chose to  wash your hair, wash your hair first as usual with your normal shampoo. After you shampoo, rinse your hair and body thoroughly to remove the shampoo.  Then [employer] and genitals (private parts) with your normal soap and rinse thoroughly to remove soap.  After that Use CHG Soap as you would any other liquid soap. You can apply CHG directly to the skin and wash gently with a scrungie or a clean washcloth.   Apply the CHG Soap to your body ONLY FROM THE NECK DOWN.  Do not use on open wounds or open sores. Avoid contact with your eyes, ears, mouth and genitals (private parts). Wash Face and genitals (private parts)  with your normal  soap.   Wash thoroughly, paying special attention to the area where your surgery will be performed.  Thoroughly rinse your body with warm water from the neck down.  DO NOT shower/wash with your normal soap after using and rinsing off the CHG Soap.  Pat yourself dry with a CLEAN TOWEL.  Wear CLEAN PAJAMAS to bed the night before surgery  Place CLEAN SHEETS on your bed the night before your surgery  DO NOT SLEEP WITH PETS.   Day of Surgery:  Take a shower with CHG soap. Wear Clean/Comfortable clothing the morning of surgery Do not apply any deodorants/lotions.   Remember to brush your teeth WITH YOUR REGULAR TOOTHPASTE.   Please read over the following fact sheets that you were given.

## 2021-03-29 LAB — ECHOCARDIOGRAM COMPLETE
Area-P 1/2: 3.91 cm2
S' Lateral: 2.7 cm

## 2021-03-30 ENCOUNTER — Encounter (HOSPITAL_COMMUNITY)
Admission: RE | Admit: 2021-03-30 | Discharge: 2021-03-30 | Disposition: A | Discharge status: Home / Self Care | Payer: BC Managed Care – PPO | Source: Ambulatory Visit | Attending: Thoracic Surgery (Cardiothoracic Vascular Surgery) | Admitting: Thoracic Surgery (Cardiothoracic Vascular Surgery)

## 2021-03-30 ENCOUNTER — Other Ambulatory Visit: Payer: Self-pay

## 2021-03-30 ENCOUNTER — Encounter (HOSPITAL_COMMUNITY): Payer: Self-pay

## 2021-03-30 ENCOUNTER — Ambulatory Visit (HOSPITAL_COMMUNITY)
Admission: RE | Admit: 2021-03-30 | Discharge: 2021-03-30 | Disposition: A | Discharge status: Home / Self Care | Payer: BC Managed Care – PPO | Source: Ambulatory Visit | Attending: Thoracic Surgery (Cardiothoracic Vascular Surgery) | Admitting: Thoracic Surgery (Cardiothoracic Vascular Surgery)

## 2021-03-30 DIAGNOSIS — Z79891 Long term (current) use of opiate analgesic: Secondary | ICD-10-CM | POA: Insufficient documentation

## 2021-03-30 DIAGNOSIS — R911 Solitary pulmonary nodule: Secondary | ICD-10-CM

## 2021-03-30 DIAGNOSIS — Z01818 Encounter for other preprocedural examination: Secondary | ICD-10-CM

## 2021-03-30 DIAGNOSIS — Z79890 Hormone replacement therapy (postmenopausal): Secondary | ICD-10-CM | POA: Insufficient documentation

## 2021-03-30 DIAGNOSIS — I10 Essential (primary) hypertension: Secondary | ICD-10-CM | POA: Insufficient documentation

## 2021-03-30 DIAGNOSIS — Z791 Long term (current) use of non-steroidal anti-inflammatories (NSAID): Secondary | ICD-10-CM | POA: Insufficient documentation

## 2021-03-30 DIAGNOSIS — Z85118 Personal history of other malignant neoplasm of bronchus and lung: Secondary | ICD-10-CM | POA: Insufficient documentation

## 2021-03-30 DIAGNOSIS — E119 Type 2 diabetes mellitus without complications: Secondary | ICD-10-CM | POA: Insufficient documentation

## 2021-03-30 DIAGNOSIS — Z7984 Long term (current) use of oral hypoglycemic drugs: Secondary | ICD-10-CM | POA: Insufficient documentation

## 2021-03-30 DIAGNOSIS — Z79899 Other long term (current) drug therapy: Secondary | ICD-10-CM | POA: Insufficient documentation

## 2021-03-30 DIAGNOSIS — Z20822 Contact with and (suspected) exposure to covid-19: Secondary | ICD-10-CM | POA: Insufficient documentation

## 2021-03-30 HISTORY — DX: Personal history of urinary calculi: Z87.442

## 2021-03-30 HISTORY — DX: Essential (primary) hypertension: I10

## 2021-03-30 HISTORY — DX: Unspecified osteoarthritis, unspecified site: M19.90

## 2021-03-30 LAB — URINALYSIS, ROUTINE W REFLEX MICROSCOPIC
Bacteria, UA: NONE SEEN
Bilirubin Urine: NEGATIVE
Glucose, UA: 150 mg/dL — AB
Hgb urine dipstick: NEGATIVE
Ketones, ur: NEGATIVE mg/dL
Leukocytes,Ua: NEGATIVE
Nitrite: NEGATIVE
Protein, ur: 30 mg/dL — AB
Specific Gravity, Urine: 1.021 (ref 1.005–1.030)
pH: 5 (ref 5.0–8.0)

## 2021-03-30 LAB — BLOOD GAS, ARTERIAL
Acid-base deficit: 0.2 mmol/L (ref 0.0–2.0)
Bicarbonate: 24.4 mmol/L (ref 20.0–28.0)
Drawn by: 602861
FIO2: 21
O2 Saturation: 97.7 %
Patient temperature: 37
pCO2 arterial: 43.4 mmHg (ref 32.0–48.0)
pH, Arterial: 7.369 (ref 7.350–7.450)
pO2, Arterial: 99.1 mmHg (ref 83.0–108.0)

## 2021-03-30 LAB — TYPE AND SCREEN
ABO/RH(D): B NEG
Antibody Screen: NEGATIVE

## 2021-03-30 LAB — CBC
HCT: 42.7 % (ref 39.0–52.0)
Hemoglobin: 14.1 g/dL (ref 13.0–17.0)
MCH: 29.2 pg (ref 26.0–34.0)
MCHC: 33 g/dL (ref 30.0–36.0)
MCV: 88.4 fL (ref 80.0–100.0)
Platelets: 194 10*3/uL (ref 150–400)
RBC: 4.83 MIL/uL (ref 4.22–5.81)
RDW: 13.6 % (ref 11.5–15.5)
WBC: 8.1 10*3/uL (ref 4.0–10.5)
nRBC: 0 % (ref 0.0–0.2)

## 2021-03-30 LAB — COMPREHENSIVE METABOLIC PANEL
ALT: 24 U/L (ref 0–44)
AST: 22 U/L (ref 15–41)
Albumin: 3.9 g/dL (ref 3.5–5.0)
Alkaline Phosphatase: 76 U/L (ref 38–126)
Anion gap: 9 (ref 5–15)
BUN: 12 mg/dL (ref 6–20)
CO2: 23 mmol/L (ref 22–32)
Calcium: 8.9 mg/dL (ref 8.9–10.3)
Chloride: 103 mmol/L (ref 98–111)
Creatinine, Ser: 0.97 mg/dL (ref 0.61–1.24)
GFR, Estimated: >60 mL/min (ref >60)
Glucose, Bld: 159 mg/dL — ABNORMAL HIGH (ref 70–99)
Potassium: 4.4 mmol/L (ref 3.5–5.1)
Sodium: 135 mmol/L (ref 135–145)
Total Bilirubin: 0.3 mg/dL (ref 0.3–1.2)
Total Protein: 6.5 g/dL (ref 6.5–8.1)

## 2021-03-30 LAB — GLUCOSE, CAPILLARY: Glucose-Capillary: 163 mg/dL — ABNORMAL HIGH (ref 70–99)

## 2021-03-30 LAB — APTT: aPTT: 29 seconds (ref 24–36)

## 2021-03-30 LAB — SURGICAL PCR SCREEN
MRSA, PCR: NEGATIVE
Staphylococcus aureus: NEGATIVE

## 2021-03-30 LAB — PROTIME-INR
INR: 1.1 (ref 0.8–1.2)
Prothrombin Time: 14.2 seconds (ref 11.4–15.2)

## 2021-03-30 LAB — SARS CORONAVIRUS 2 (TAT 6-24 HRS): SARS Coronavirus 2: NEGATIVE

## 2021-03-30 NOTE — Progress Notes (Signed)
Abnormal lab in PAT: Glucose, UA - 150; Protein, UA - 30. Dr. Roxan Hockey office was notified Thurmond Butts, Rolena Infante, [place])

## 2021-03-30 NOTE — Progress Notes (Addendum)
PCP - [name], MD Cardiologist - Lauree Chandler, MD Pulmonologist - Joneen Boers, MD  PPM/ICD - denies Device Orders - n/a Rep Notified - n/a  Chest x-ray - 03/30/2021 EKG - 03/11/2021 Stress Test - 03/16/2021 ECHO - 03/27/2021 Cardiac Cath - denies  Sleep Study - no CPAP - n/a  Fasting Blood Sugar - 120-125 Checks Blood Sugar 2 times a day CBG today - 163 A1C - 5.7 on 03/25/2021 (CE)  Blood Thinner Instructions: no  Aspirin Instructions: Aspirin - patient was instructed to call MD and ask if he must stop taking Aspirin prior surgery. Patient verbalized understanding.  Patient was instructed: As of today, STOP taking any Aspirin (unless otherwise instructed by your surgeon) Aleve, Naproxen, Ibuprofen, Motrin, Advil, Goody's, BC's, all herbal medications, fish oil, and all vitamins  ERAS Protcol - no  COVID TEST- done in PAT on 03/30/2021  Anesthesia review: yes - cardiac history; UA abnormal in PAT  Patient denies shortness of breath, fever, cough and chest pain at PAT appointment   All instructions explained to the patient, with a verbal understanding of the material. Patient agrees to go over the instructions while at home for a better understanding. Patient also instructed to self quarantine after being tested for COVID-19. The opportunity to ask questions was provided.

## 2021-03-31 NOTE — Progress Notes (Signed)
Anesthesia Chart Review:   Case: [id number] Date/Time: 04/01/21 0815   Procedure: XI ROBOTIC ASSISTED THORASCOPY-WEDGE RESECTION, possible lower lobectomy (Right: Chest)   Anesthesia type: General   Pre-op diagnosis: RLL LUNG NODULE   Location: [facility] OR ROOM 10 / [place] OR   Surgeons: [name], MD       DISCUSSION: Bill [name] is 60 years old with hx HTN, DM   VS: BP 123/75   Pulse 100   Temp 36.8 C   Resp 19   Ht 5\' 10"  (1.778 m)   Wt 89.3 kg   SpO2 98%   BMI 28.24 kg/m   PROVIDERS: - PCP is [name], MD - Cardiologist is Lauree Chandler, MD who cleared Bill [name] for surgery in a comment on stress test results 03/16/21 - Pulmonologist is Joneen Boers, MD (notes in care everywhere)   LABS: Labs reviewed: Acceptable for surgery. - HbA1c was 5.7 on 03/25/21 (care everywhere)   (all labs ordered are listed, but only abnormal results are displayed)  Labs Reviewed  GLUCOSE, CAPILLARY - Abnormal; Notable for the following components:      Result Value   Glucose-Capillary 163 (*)    All other components within normal limits  COMPREHENSIVE METABOLIC PANEL - Abnormal; Notable for the following components:   Glucose, Bld 159 (*)    All other components within normal limits  URINALYSIS, ROUTINE W REFLEX MICROSCOPIC - Abnormal; Notable for the following components:   Glucose, UA 150 (*)    Protein, ur 30 (*)    All other components within normal limits  SURGICAL PCR SCREEN  SARS CORONAVIRUS 2 (TAT 6-24 HRS)  CBC  BLOOD GAS, ARTERIAL  PROTIME-INR  APTT  TYPE AND SCREEN     IMAGES: CXR 03/30/21: results pending  CT chest 03/10/21:  1.  3.6 X 3.0 cm mixed attenuation lesion with cavitation noted posterior right costophrenic sulcus, not substantially changed since 08/02/2020, but progressive since 2018.  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 2.  5 mm perifissural nodule anterior right lung is new since 2018.  Attention  on follow-up recommended. 3.  Additional scattered tiny bilateral pulmonary nodules are stable since 2018 compatible with benign etiology 4.  Aortic atherosclerosis.  EKG 03/11/21: SR   CV: Echo 03/29/21:  1. Left ventricular ejection fraction, by estimation, is 65 to 70%. The left ventricle has normal function. The left ventricle has no regional wall motion abnormalities. There is mild left ventricular hypertrophy. Left ventricular diastolic parameters were normal.   2. Right ventricular systolic function is normal. The right ventricular size is normal.   3. The mitral valve is normal in structure. No evidence of mitral valve regurgitation. No evidence of mitral stenosis.   4. The aortic valve is tricuspid. Aortic valve regurgitation is not  visualized. No aortic stenosis is present.   Exercise stress test 03/16/21:    The study is normal. The study is low risk.   No ST deviation was noted.   Left ventricular function is normal. Nuclear stress EF: 63 %. The left ventricular ejection fraction is normal (55-65%).   Prior study not available for comparison.  Past Medical History:  Diagnosis Date   Arthritis    Diabetes mellitus ([place])    History of kidney stones    Hyperlipidemia    Hypertension    Lung mass    Tobacco abuse     Past Surgical History:  Procedure Laterality Date   ANKLE SURGERY  BACK SURGERY     KNEE CARTILAGE SURGERY      MEDICATIONS:  atorvastatin (LIPITOR) 40 MG tablet   DULoxetine (CYMBALTA) 30 MG capsule   gabapentin (NEURONTIN) 400 MG capsule   lisinopril (ZESTRIL) 10 MG tablet   Melatonin 5 MG CAPS   meloxicam (MOBIC) 15 MG tablet   metFORMIN (GLUCOPHAGE-XR) 500 MG 24 hr tablet   methadone (DOLOPHINE) 10 MG tablet   tadalafil (CIALIS) 5 MG tablet   Testosterone Cypionate 200 MG/ML SOLN   traMADol (ULTRAM) 50 MG tablet   No current facility-administered medications for this encounter.    If CXR acceptable and no acute changes, I anticipate Bill [name]  can proceed with surgery as scheduled.  Willeen Cass, PhD, FNP-BC [facility]/Anesthesiology Phone: [phone] 03/31/2021 4:10 PM

## 2021-03-31 NOTE — Anesthesia Preprocedure Evaluation (Addendum)
Anesthesia Evaluation  Patient identified by MRN, date of birth, ID band Patient awake    Reviewed: Allergy & Precautions, NPO status , Patient's Chart, lab work & pertinent test results  History of Anesthesia Complications Negative for: history of anesthetic complications  Airway Mallampati: II  TM Distance: >3 FB Neck ROM: Full    Dental  (+) Chipped, Dental Advisory Given   Pulmonary former smoker,    Pulmonary exam normal        Cardiovascular hypertension, Pt. on medications Normal cardiovascular exam  Echo 03/29/21:  1. Left ventricular ejection fraction, by estimation, is 65 to 70%. The left ventricle has normal function. The left ventricle has no regional wall motion abnormalities. There is mild left ventricular hypertrophy. Left ventricular diastolic parameters were normal.  2. Right ventricular systolic function is normal. The right ventricular size is normal.  3. The mitral valve is normal in structure. No evidence of mitral valve regurgitation. No evidence of mitral stenosis.  4. The aortic valve is tricuspid. Aortic valve regurgitation is not  visualized. No aortic stenosis is present.   Exercise stress test 03/16/21:  . The study is normal. The study is low risk. . No ST deviation was noted. . Left ventricular function is normal. Nuclear stress EF: 63 %. The left ventricular ejection fraction is normal (55-65%). . Prior study not available for comparison.    Neuro/Psych PSYCHIATRIC DISORDERS Anxiety Depression negative neurological ROS     GI/Hepatic negative GI ROS, Neg liver ROS,   Endo/Other  diabetes  Renal/GU negative Renal ROS     Musculoskeletal negative musculoskeletal ROS (+)   Abdominal   Peds  Hematology negative hematology ROS (+)   Anesthesia Other Findings   Reproductive/Obstetrics                           Anesthesia Physical Anesthesia Plan  ASA:  3  Anesthesia Plan: General   Post-op Pain Management:    Induction: Intravenous  PONV Risk Score and Plan: 3 and Ondansetron, Dexamethasone and Midazolam  Airway Management Planned: Double Lumen EBT  Additional Equipment: Arterial line  Intra-op Plan:   Post-operative Plan: Post-operative intubation/ventilation  Informed Consent: I have reviewed the patients History and Physical, chart, labs and discussed the procedure including the risks, benefits and alternatives for the proposed anesthesia with the patient or authorized representative who has indicated his/her understanding and acceptance.     Dental advisory given  Plan Discussed with: Anesthesiologist and CRNA  Anesthesia Plan Comments: (See APP note by Durel Salts, FNP )      Anesthesia Quick Evaluation

## 2021-04-01 ENCOUNTER — Encounter (HOSPITAL_COMMUNITY): Payer: Self-pay | Admitting: Thoracic Surgery (Cardiothoracic Vascular Surgery)

## 2021-04-01 ENCOUNTER — Inpatient Hospital Stay (HOSPITAL_COMMUNITY)
Admission: RE | Admit: 2021-04-01 | Discharge: 2021-04-05 | DRG: 165 | Disposition: A | Discharge status: Home / Self Care | Payer: BC Managed Care – PPO | Attending: Thoracic Surgery (Cardiothoracic Vascular Surgery) | Admitting: Thoracic Surgery (Cardiothoracic Vascular Surgery)

## 2021-04-01 ENCOUNTER — Inpatient Hospital Stay (HOSPITAL_COMMUNITY): Payer: BC Managed Care – PPO

## 2021-04-01 ENCOUNTER — Inpatient Hospital Stay (HOSPITAL_COMMUNITY): Payer: BC Managed Care – PPO | Admitting: Anesthesiology

## 2021-04-01 ENCOUNTER — Inpatient Hospital Stay (HOSPITAL_COMMUNITY): Payer: BC Managed Care – PPO | Admitting: Emergency Medicine

## 2021-04-01 ENCOUNTER — Other Ambulatory Visit: Payer: Self-pay

## 2021-04-01 ENCOUNTER — Encounter (HOSPITAL_COMMUNITY)
Admission: RE | Disposition: A | Discharge status: Home / Self Care | Payer: Self-pay | Source: Home / Self Care | Attending: Thoracic Surgery (Cardiothoracic Vascular Surgery)

## 2021-04-01 DIAGNOSIS — I2510 Atherosclerotic heart disease of native coronary artery without angina pectoris: Secondary | ICD-10-CM | POA: Diagnosis present

## 2021-04-01 DIAGNOSIS — R42 Dizziness and giddiness: Secondary | ICD-10-CM | POA: Diagnosis present

## 2021-04-01 DIAGNOSIS — Z20822 Contact with and (suspected) exposure to covid-19: Secondary | ICD-10-CM | POA: Diagnosis present

## 2021-04-01 DIAGNOSIS — Z7982 Long term (current) use of aspirin: Secondary | ICD-10-CM

## 2021-04-01 DIAGNOSIS — F17210 Nicotine dependence, cigarettes, uncomplicated: Secondary | ICD-10-CM | POA: Diagnosis present

## 2021-04-01 DIAGNOSIS — Z880 Allergy status to penicillin: Secondary | ICD-10-CM

## 2021-04-01 DIAGNOSIS — K660 Peritoneal adhesions (postprocedural) (postinfection): Secondary | ICD-10-CM | POA: Diagnosis present

## 2021-04-01 DIAGNOSIS — E782 Mixed hyperlipidemia: Secondary | ICD-10-CM | POA: Diagnosis present

## 2021-04-01 DIAGNOSIS — E1169 Type 2 diabetes mellitus with other specified complication: Secondary | ICD-10-CM | POA: Diagnosis present

## 2021-04-01 DIAGNOSIS — J939 Pneumothorax, unspecified: Secondary | ICD-10-CM

## 2021-04-01 DIAGNOSIS — Z7984 Long term (current) use of oral hypoglycemic drugs: Secondary | ICD-10-CM

## 2021-04-01 DIAGNOSIS — Z791 Long term (current) use of non-steroidal anti-inflammatories (NSAID): Secondary | ICD-10-CM | POA: Diagnosis not present

## 2021-04-01 DIAGNOSIS — Z87442 Personal history of urinary calculi: Secondary | ICD-10-CM

## 2021-04-01 DIAGNOSIS — Z79899 Other long term (current) drug therapy: Secondary | ICD-10-CM | POA: Diagnosis not present

## 2021-04-01 DIAGNOSIS — F32A Depression, unspecified: Secondary | ICD-10-CM | POA: Diagnosis present

## 2021-04-01 DIAGNOSIS — C3431 Malignant neoplasm of lower lobe, right bronchus or lung: Principal | ICD-10-CM | POA: Diagnosis present

## 2021-04-01 DIAGNOSIS — I10 Essential (primary) hypertension: Secondary | ICD-10-CM | POA: Diagnosis present

## 2021-04-01 DIAGNOSIS — Z4682 Encounter for fitting and adjustment of non-vascular catheter: Secondary | ICD-10-CM

## 2021-04-01 DIAGNOSIS — M25511 Pain in right shoulder: Secondary | ICD-10-CM | POA: Diagnosis not present

## 2021-04-01 DIAGNOSIS — E1140 Type 2 diabetes mellitus with diabetic neuropathy, unspecified: Secondary | ICD-10-CM | POA: Diagnosis present

## 2021-04-01 DIAGNOSIS — Z09 Encounter for follow-up examination after completed treatment for conditions other than malignant neoplasm: Secondary | ICD-10-CM

## 2021-04-01 DIAGNOSIS — G894 Chronic pain syndrome: Secondary | ICD-10-CM | POA: Diagnosis present

## 2021-04-01 DIAGNOSIS — Z902 Acquired absence of lung [part of]: Secondary | ICD-10-CM

## 2021-04-01 DIAGNOSIS — R911 Solitary pulmonary nodule: Secondary | ICD-10-CM

## 2021-04-01 DIAGNOSIS — Z419 Encounter for procedure for purposes other than remedying health state, unspecified: Secondary | ICD-10-CM

## 2021-04-01 HISTORY — PX: INTERCOSTAL NERVE BLOCK: SHX5021

## 2021-04-01 HISTORY — PX: LYMPH NODE DISSECTION: SHX5087

## 2021-04-01 LAB — GLUCOSE, CAPILLARY
Glucose-Capillary: 123 mg/dL — ABNORMAL HIGH (ref 70–99)
Glucose-Capillary: 152 mg/dL — ABNORMAL HIGH (ref 70–99)

## 2021-04-01 LAB — ABO/RH: ABO/RH(D): B NEG

## 2021-04-01 SURGERY — WEDGE RESECTION, LUNG, ROBOT-ASSISTED, THORACOSCOPIC
Anesthesia: General | Site: Chest | Laterality: Right

## 2021-04-01 MED ORDER — CHLORHEXIDINE GLUCONATE CLOTH 2 % EX PADS: 6.0000 | MEDICATED_PAD | Freq: Every day | CUTANEOUS | Status: DC

## 2021-04-01 MED ORDER — PHENYLEPHRINE HCL-NACL 20-0.9 MG/250ML-% IV SOLN
INTRAVENOUS | Status: DC | PRN
  Administered 2021-04-01: 15 ug/min via INTRAVENOUS

## 2021-04-01 MED ORDER — BISACODYL 5 MG PO TBEC
10.0000 mg | DELAYED_RELEASE_TABLET | Freq: Every day | ORAL | Status: DC
  Administered 2021-04-01 – 2021-04-05 (×5): 10 mg via ORAL
  Filled 2021-04-01 (×6): qty 2

## 2021-04-01 MED ORDER — FENTANYL CITRATE (PF) 250 MCG/5ML IJ SOLN
INTRAMUSCULAR | Status: DC | PRN
  Administered 2021-04-01 (×2): 50 ug via INTRAVENOUS
  Administered 2021-04-01: 25 ug via INTRAVENOUS
  Administered 2021-04-01: 50 ug via INTRAVENOUS
  Administered 2021-04-01: 25 ug via INTRAVENOUS
  Administered 2021-04-01 (×2): 50 ug via INTRAVENOUS

## 2021-04-01 MED ORDER — AMISULPRIDE (ANTIEMETIC) 5 MG/2ML IV SOLN: 10.0000 mg | Freq: Once | INTRAVENOUS | Status: DC | PRN

## 2021-04-01 MED ORDER — KETOROLAC TROMETHAMINE 15 MG/ML IJ SOLN
15.0000 mg | Freq: Four times a day (QID) | INTRAMUSCULAR | Status: DC
  Administered 2021-04-02 – 2021-04-03 (×6): 15 mg via INTRAVENOUS
  Filled 2021-04-01 (×6): qty 1

## 2021-04-01 MED ORDER — DEXAMETHASONE SODIUM PHOSPHATE 10 MG/ML IJ SOLN
INTRAMUSCULAR | Status: AC
  Filled 2021-04-01: qty 2

## 2021-04-01 MED ORDER — ACETAMINOPHEN 160 MG/5ML PO SOLN
1000.0000 mg | Freq: Four times a day (QID) | ORAL | Status: DC
Start: 2021-04-01 — End: 2021-04-05
  Filled 2021-04-01 (×2): qty 40.6

## 2021-04-01 MED ORDER — CHLORHEXIDINE GLUCONATE 0.12 % MT SOLN
15.0000 mL | OROMUCOSAL | Status: AC
  Filled 2021-04-01: qty 15

## 2021-04-01 MED ORDER — PHENYLEPHRINE 40 MCG/ML (10ML) SYRINGE FOR IV PUSH (FOR BLOOD PRESSURE SUPPORT)
PREFILLED_SYRINGE | INTRAVENOUS | Status: AC
  Filled 2021-04-01: qty 10

## 2021-04-01 MED ORDER — ENOXAPARIN SODIUM 40 MG/0.4ML IJ SOSY
40.0000 mg | PREFILLED_SYRINGE | Freq: Every day | INTRAMUSCULAR | Status: DC
  Filled 2021-04-01 (×3): qty 0.4

## 2021-04-01 MED ORDER — MIDAZOLAM HCL 2 MG/2ML IJ SOLN
INTRAMUSCULAR | Status: AC
  Filled 2021-04-01: qty 2

## 2021-04-01 MED ORDER — FENTANYL CITRATE (PF) 100 MCG/2ML IJ SOLN
INTRAMUSCULAR | Status: AC
  Filled 2021-04-01: qty 2

## 2021-04-01 MED ORDER — GABAPENTIN 400 MG PO CAPS
400.0000 mg | ORAL_CAPSULE | Freq: Two times a day (BID) | ORAL | Status: DC
  Administered 2021-04-02 – 2021-04-05 (×7): 400 mg via ORAL
  Filled 2021-04-01 (×7): qty 1

## 2021-04-01 MED ORDER — HYDROMORPHONE HCL 1 MG/ML IJ SOLN
INTRAMUSCULAR | Status: AC
  Filled 2021-04-01: qty 1

## 2021-04-01 MED ORDER — HEMOSTATIC AGENTS (NO CHARGE) OPTIME
TOPICAL | Status: DC | PRN
  Administered 2021-04-01: 3 via TOPICAL

## 2021-04-01 MED ORDER — PROPOFOL 10 MG/ML IV BOLUS
INTRAVENOUS | Status: AC
  Filled 2021-04-01: qty 20

## 2021-04-01 MED ORDER — FENTANYL CITRATE (PF) 250 MCG/5ML IJ SOLN
INTRAMUSCULAR | Status: AC
  Filled 2021-04-01: qty 5

## 2021-04-01 MED ORDER — KETOROLAC TROMETHAMINE 30 MG/ML IJ SOLN
30.0000 mg | Freq: Once | INTRAMUSCULAR | Status: AC
  Administered 2021-04-01: 30 mg via INTRAVENOUS

## 2021-04-01 MED ORDER — ROCURONIUM BROMIDE 10 MG/ML (PF) SYRINGE
PREFILLED_SYRINGE | INTRAVENOUS | Status: AC
  Filled 2021-04-01: qty 30

## 2021-04-01 MED ORDER — DEXAMETHASONE SODIUM PHOSPHATE 10 MG/ML IJ SOLN
INTRAMUSCULAR | Status: DC | PRN
  Administered 2021-04-01: 10 mg via INTRAVENOUS

## 2021-04-01 MED ORDER — LACTATED RINGERS IV SOLN: INTRAVENOUS | Status: DC | PRN

## 2021-04-01 MED ORDER — LIDOCAINE 2% (20 MG/ML) 5 ML SYRINGE: INTRAMUSCULAR | Status: DC | PRN

## 2021-04-01 MED ORDER — BUPIVACAINE HCL (PF) 0.5 % IJ SOLN
INTRAMUSCULAR | Status: AC
  Filled 2021-04-01: qty 30

## 2021-04-01 MED ORDER — SODIUM CHLORIDE 0.9 % IV SOLN
INTRAVENOUS | Status: AC | PRN
  Administered 2021-04-01: 1000 mL via INTRAMUSCULAR

## 2021-04-01 MED ORDER — LACTATED RINGERS IV SOLN: INTRAVENOUS | Status: DC

## 2021-04-01 MED ORDER — MELATONIN 5 MG PO TABS
5.0000 mg | ORAL_TABLET | Freq: Every evening | ORAL | Status: DC | PRN
  Administered 2021-04-02 – 2021-04-03 (×2): 5 mg via ORAL
  Filled 2021-04-01 (×2): qty 1

## 2021-04-01 MED ORDER — SUGAMMADEX SODIUM 200 MG/2ML IV SOLN
INTRAVENOUS | Status: DC | PRN
  Administered 2021-04-01: 200 mg via INTRAVENOUS

## 2021-04-01 MED ORDER — SODIUM CHLORIDE 0.9 % IV SOLN: INTRAVENOUS | Status: DC

## 2021-04-01 MED ORDER — ONDANSETRON HCL 4 MG/2ML IJ SOLN
INTRAMUSCULAR | Status: AC
  Filled 2021-04-01: qty 4

## 2021-04-01 MED ORDER — VANCOMYCIN HCL IN DEXTROSE 1-5 GM/200ML-% IV SOLN: 1000.0000 mg | INTRAVENOUS | Status: AC

## 2021-04-01 MED ORDER — ROCURONIUM BROMIDE 10 MG/ML (PF) SYRINGE
PREFILLED_SYRINGE | INTRAVENOUS | Status: DC | PRN
  Administered 2021-04-01 (×3): 20 mg via INTRAVENOUS
  Administered 2021-04-01: 100 mg via INTRAVENOUS
  Administered 2021-04-01: 20 mg via INTRAVENOUS

## 2021-04-01 MED ORDER — HYDROMORPHONE HCL 1 MG/ML IJ SOLN
INTRAMUSCULAR | Status: AC
  Filled 2021-04-01: qty 0.5

## 2021-04-01 MED ORDER — ACETAMINOPHEN 500 MG PO TABS
1000.0000 mg | ORAL_TABLET | Freq: Once | ORAL | Status: AC
  Administered 2021-04-01: 1000 mg via ORAL
  Filled 2021-04-01: qty 2

## 2021-04-01 MED ORDER — PROPOFOL 10 MG/ML IV BOLUS
INTRAVENOUS | Status: DC | PRN
  Administered 2021-04-01: 200 mg via INTRAVENOUS

## 2021-04-01 MED ORDER — MIDAZOLAM HCL 2 MG/2ML IJ SOLN
INTRAMUSCULAR | Status: DC | PRN
  Administered 2021-04-01: 2 mg via INTRAVENOUS

## 2021-04-01 MED ORDER — VANCOMYCIN HCL IN DEXTROSE 1-5 GM/200ML-% IV SOLN
INTRAVENOUS | Status: AC
  Administered 2021-04-01: 1000 mg via INTRAVENOUS
  Filled 2021-04-01: qty 200

## 2021-04-01 MED ORDER — ONDANSETRON HCL 4 MG/2ML IJ SOLN
INTRAMUSCULAR | Status: DC | PRN
  Administered 2021-04-01: 4 mg via INTRAVENOUS

## 2021-04-01 MED ORDER — BUPIVACAINE LIPOSOME 1.3 % IJ SUSP
INTRAMUSCULAR | Status: AC
  Filled 2021-04-01: qty 20

## 2021-04-01 MED ORDER — SODIUM CHLORIDE FLUSH 0.9 % IV SOLN
INTRAVENOUS | Status: DC | PRN
  Administered 2021-04-01: 100 mL

## 2021-04-01 MED ORDER — KETOROLAC TROMETHAMINE 30 MG/ML IJ SOLN
INTRAMUSCULAR | Status: AC
  Filled 2021-04-01: qty 1

## 2021-04-01 MED ORDER — DULOXETINE HCL 30 MG PO CPEP
30.0000 mg | ORAL_CAPSULE | Freq: Every day | ORAL | Status: DC
  Administered 2021-04-02 – 2021-04-05 (×4): 30 mg via ORAL
  Filled 2021-04-01 (×4): qty 1

## 2021-04-01 MED ORDER — TRAMADOL HCL 50 MG PO TABS
50.0000 mg | ORAL_TABLET | Freq: Four times a day (QID) | ORAL | Status: DC | PRN
  Administered 2021-04-02 – 2021-04-03 (×6): 100 mg via ORAL
  Administered 2021-04-04: 50 mg via ORAL
  Administered 2021-04-05: 100 mg via ORAL
  Filled 2021-04-01 (×3): qty 2
  Filled 2021-04-01: qty 1
  Filled 2021-04-01 (×4): qty 2

## 2021-04-01 MED ORDER — 0.9 % SODIUM CHLORIDE (POUR BTL) OPTIME
TOPICAL | Status: DC | PRN
  Administered 2021-04-01: 2000 mL

## 2021-04-01 MED ORDER — CHLORHEXIDINE GLUCONATE 0.12 % MT SOLN
OROMUCOSAL | Status: AC
  Administered 2021-04-01: 15 mL
  Filled 2021-04-01: qty 15

## 2021-04-01 MED ORDER — LIDOCAINE 2% (20 MG/ML) 5 ML SYRINGE
INTRAMUSCULAR | Status: DC | PRN
Start: 2021-04-01 — End: 2021-04-01
  Administered 2021-04-01: 100 mg via INTRAVENOUS

## 2021-04-01 MED ORDER — INSULIN ASPART 100 UNIT/ML IJ SOLN
0.0000 [IU] | Freq: Four times a day (QID) | INTRAMUSCULAR | Status: DC
  Administered 2021-04-02: 2 [IU] via SUBCUTANEOUS
  Administered 2021-04-02: 4 [IU] via SUBCUTANEOUS

## 2021-04-01 MED ORDER — ACETAMINOPHEN 500 MG PO TABS
1000.0000 mg | ORAL_TABLET | Freq: Four times a day (QID) | ORAL | Status: DC
Start: 2021-04-01 — End: 2021-04-05
  Administered 2021-04-01 – 2021-04-05 (×13): 1000 mg via ORAL
  Filled 2021-04-01 (×16): qty 2

## 2021-04-01 MED ORDER — HYDROMORPHONE HCL 1 MG/ML IJ SOLN
INTRAMUSCULAR | Status: DC | PRN
  Administered 2021-04-01 (×2): .5 mg via INTRAVENOUS

## 2021-04-01 MED ORDER — PROMETHAZINE HCL 25 MG/ML IJ SOLN: 6.2500 mg | INTRAMUSCULAR | Status: DC | PRN

## 2021-04-01 MED ORDER — HYDROMORPHONE HCL 1 MG/ML IJ SOLN
0.2500 mg | INTRAMUSCULAR | Status: DC | PRN
  Administered 2021-04-01 (×4): 0.5 mg via INTRAVENOUS

## 2021-04-01 MED ORDER — SENNOSIDES-DOCUSATE SODIUM 8.6-50 MG PO TABS
1.0000 | ORAL_TABLET | Freq: Every day | ORAL | Status: DC
  Administered 2021-04-01 – 2021-04-04 (×4): 1 via ORAL
  Filled 2021-04-01 (×4): qty 1

## 2021-04-01 MED ORDER — LISINOPRIL 10 MG PO TABS: 10.0000 mg | ORAL_TABLET | Freq: Every day | ORAL | Status: DC

## 2021-04-01 MED ORDER — LIDOCAINE 2% (20 MG/ML) 5 ML SYRINGE
INTRAMUSCULAR | Status: AC
  Filled 2021-04-01: qty 10

## 2021-04-01 MED ORDER — ONDANSETRON HCL 4 MG/2ML IJ SOLN: 4.0000 mg | Freq: Four times a day (QID) | INTRAMUSCULAR | Status: DC | PRN

## 2021-04-01 MED ORDER — METHADONE HCL 10 MG PO TABS
20.0000 mg | ORAL_TABLET | Freq: Three times a day (TID) | ORAL | Status: DC
  Administered 2021-04-01 – 2021-04-05 (×11): 20 mg via ORAL
  Filled 2021-04-01 (×11): qty 2

## 2021-04-01 MED ORDER — ATORVASTATIN CALCIUM 40 MG PO TABS
40.0000 mg | ORAL_TABLET | Freq: Every day | ORAL | Status: DC
  Administered 2021-04-02 – 2021-04-05 (×4): 40 mg via ORAL
  Filled 2021-04-01 (×4): qty 1

## 2021-04-01 MED ORDER — FENTANYL CITRATE (PF) 100 MCG/2ML IJ SOLN
25.0000 ug | INTRAMUSCULAR | Status: DC | PRN
  Administered 2021-04-01 (×3): 50 ug via INTRAVENOUS

## 2021-04-01 MED ORDER — IPRATROPIUM-ALBUTEROL 0.5-2.5 (3) MG/3ML IN SOLN
3.0000 mL | Freq: Four times a day (QID) | RESPIRATORY_TRACT | Status: DC
  Administered 2021-04-02 – 2021-04-04 (×9): 3 mL via RESPIRATORY_TRACT
  Filled 2021-04-01 (×9): qty 3

## 2021-04-01 MED ORDER — CELECOXIB 200 MG PO CAPS
200.0000 mg | ORAL_CAPSULE | Freq: Once | ORAL | Status: AC
  Administered 2021-04-01: 200 mg via ORAL
  Filled 2021-04-01: qty 1

## 2021-04-01 SURGICAL SUPPLY — 98 items
ADH SKN CLS APL DERMABOND .7 (GAUZE/BANDAGES/DRESSINGS) ×1
BAG LAPAROSCOPIC 12 15 PORT 16 (BASKET) IMPLANT
BAG RETRIEVAL 12/15 (BASKET) ×2
BAG TISS RTRVL C300 12X14 (MISCELLANEOUS) ×2
BLADE CLIPPER SURG (BLADE) ×1 IMPLANT
CANISTER SUCT 3000ML PPV (MISCELLANEOUS) ×4 IMPLANT
CANNULA REDUC XI 12-8 STAPL (CANNULA) ×4
CANNULA REDUCER 12-8 DVNC XI (CANNULA) ×2 IMPLANT
CATH THORACIC 28FR (CATHETERS) IMPLANT
CATH THORACIC 28FR RT ANG (CATHETERS) IMPLANT
CATH THORACIC 36FR (CATHETERS) IMPLANT
CATH THORACIC 36FR RT ANG (CATHETERS) IMPLANT
CLIP LIGATING HEM O LOK PURPLE (MISCELLANEOUS) ×1 IMPLANT
CLIP LIGATING HEMO O LOK GREEN (MISCELLANEOUS) ×1 IMPLANT
CLIP TI WIDE RED SMALL 6 (CLIP) ×1 IMPLANT
CNTNR URN SCR LID CUP LEK RST (MISCELLANEOUS) ×5 IMPLANT
CONT SPEC 4OZ STRL OR WHT (MISCELLANEOUS) ×30
DEFOGGER SCOPE WARMER CLEARIFY (MISCELLANEOUS) ×3 IMPLANT
DERMABOND ADVANCED (GAUZE/BANDAGES/DRESSINGS) ×1
DERMABOND ADVANCED .7 DNX12 (GAUZE/BANDAGES/DRESSINGS) ×1 IMPLANT
DRAIN CHANNEL 28F RND 3/8 FF (WOUND CARE) IMPLANT
DRAIN CHANNEL 32F RND 10.7 FF (WOUND CARE) IMPLANT
DRAPE ARM DVNC X/XI (DISPOSABLE) ×4 IMPLANT
DRAPE COLUMN DVNC XI (DISPOSABLE) ×1 IMPLANT
DRAPE CV SPLIT W-CLR ANES SCRN (DRAPES) ×2 IMPLANT
DRAPE DA VINCI XI ARM (DISPOSABLE) ×10
DRAPE DA VINCI XI COLUMN (DISPOSABLE) ×2
DRAPE HALF SHEET 40X57 (DRAPES) ×2 IMPLANT
DRAPE INCISE IOBAN 66X45 STRL (DRAPES) IMPLANT
DRAPE ORTHO SPLIT 77X108 STRL (DRAPES) ×2
DRAPE SURG ORHT 6 SPLT 77X108 (DRAPES) ×1 IMPLANT
ELECT BLADE 6.5 EXT (BLADE) ×1 IMPLANT
ELECT REM PT RETURN 9FT ADLT (ELECTROSURGICAL) ×2
ELECTRODE REM PT RTRN 9FT ADLT (ELECTROSURGICAL) ×1 IMPLANT
FELT TEFLON 1X6 (MISCELLANEOUS) ×2 IMPLANT
GAUZE KITTNER 4X5 RF (MISCELLANEOUS) ×6 IMPLANT
GAUZE SPONGE 4X4 12PLY STRL (GAUZE/BANDAGES/DRESSINGS) ×2 IMPLANT
GLOVE TRIUMPH SURG SIZE 7.5 (KITS) ×4 IMPLANT
GOWN STRL REUS W/ TWL LRG LVL3 (GOWN DISPOSABLE) ×2 IMPLANT
GOWN STRL REUS W/ TWL XL LVL3 (GOWN DISPOSABLE) ×2 IMPLANT
GOWN STRL REUS W/TWL 2XL LVL3 (GOWN DISPOSABLE) ×2 IMPLANT
GOWN STRL REUS W/TWL LRG LVL3 (GOWN DISPOSABLE) ×8
GOWN STRL REUS W/TWL XL LVL3 (GOWN DISPOSABLE) ×4
HEMOSTAT SURGICEL 2X14 (HEMOSTASIS) ×6 IMPLANT
IRRIGATION STRYKERFLOW (MISCELLANEOUS) ×1 IMPLANT
IRRIGATOR STRYKERFLOW (MISCELLANEOUS) ×2
KIT BASIN OR (CUSTOM PROCEDURE TRAY) ×2 IMPLANT
KIT TURNOVER KIT B (KITS) ×2 IMPLANT
NDL HYPO 25GX1X1/2 BEV (NEEDLE) ×1 IMPLANT
NDL SPNL 22GX3.5 QUINCKE BK (NEEDLE) ×1 IMPLANT
NEEDLE HYPO 25GX1X1/2 BEV (NEEDLE) ×2 IMPLANT
NEEDLE SPNL 22GX3.5 QUINCKE BK (NEEDLE) ×2 IMPLANT
NS IRRIG 1000ML POUR BTL (IV SOLUTION) ×3 IMPLANT
PACK CHEST (CUSTOM PROCEDURE TRAY) ×2 IMPLANT
PAD ARMBOARD 7.5X6 YLW CONV (MISCELLANEOUS) ×4 IMPLANT
RELOAD STAPLE 45 2.5 WHT DVNC (STAPLE) IMPLANT
RELOAD STAPLE 45 3.5 BLU DVNC (STAPLE) IMPLANT
RELOAD STAPLE 45 4.3 GRN DVNC (STAPLE) IMPLANT
RELOAD STAPLE 45 4.6 BLK DVNC (STAPLE) IMPLANT
RELOAD STAPLER 2.5X45 WHT DVNC (STAPLE) ×3 IMPLANT
RELOAD STAPLER 3.5X45 BLU DVNC (STAPLE) ×9 IMPLANT
RELOAD STAPLER 4.3X45 GRN DVNC (STAPLE) ×3 IMPLANT
RELOAD STAPLER 45 4.6 BLK DVNC (STAPLE) ×2 IMPLANT
SEAL CANN UNIV 5-8 DVNC XI (MISCELLANEOUS) ×2 IMPLANT
SEAL XI 5MM-8MM UNIVERSAL (MISCELLANEOUS) ×4
SET TRI-LUMEN FLTR TB AIRSEAL (TUBING) ×2 IMPLANT
SOLUTION ELECTROLUBE (MISCELLANEOUS) ×2 IMPLANT
SPONGE TONSIL TAPE 1 RFD (DISPOSABLE) IMPLANT
STAPLER 45 SUREFORM CVD (STAPLE) ×4
STAPLER 45 SUREFORM CVD DVNC (STAPLE) IMPLANT
STAPLER CANNULA SEAL DVNC XI (STAPLE) ×2 IMPLANT
STAPLER CANNULA SEAL XI (STAPLE) ×4
STAPLER RELOAD 2.5X45 WHITE (STAPLE) ×6
STAPLER RELOAD 2.5X45 WHT DVNC (STAPLE) ×3
STAPLER RELOAD 3.5X45 BLU DVNC (STAPLE) ×9
STAPLER RELOAD 3.5X45 BLUE (STAPLE) ×18
STAPLER RELOAD 4.3X45 GREEN (STAPLE) ×6
STAPLER RELOAD 4.3X45 GRN DVNC (STAPLE) ×3
STAPLER RELOAD 45 4.6 BLK (STAPLE) ×4
STAPLER RELOAD 45 4.6 BLK DVNC (STAPLE) ×2
SUT ETHIBOND 0 36 GRN (SUTURE) ×1 IMPLANT
SUT SILK  1 MH (SUTURE) ×4
SUT SILK 1 MH (SUTURE) ×2 IMPLANT
SUT SILK 3 0SH CR/8 30 (SUTURE) ×1 IMPLANT
SUT VIC AB 1 CTX 36 (SUTURE) ×2
SUT VIC AB 1 CTX36XBRD ANBCTR (SUTURE) IMPLANT
SUT VIC AB 2-0 CTX 36 (SUTURE) ×1 IMPLANT
SUT VIC AB 3-0 X1 27 (SUTURE) ×4 IMPLANT
SUT VICRYL 0 TIES 12 18 (SUTURE) ×3 IMPLANT
SUT VICRYL 0 UR6 27IN ABS (SUTURE) ×4 IMPLANT
SUT VLOC 180 0 6IN GS21 (SUTURE) ×5 IMPLANT
SYR 20ML LL LF (SYRINGE) ×5 IMPLANT
SYSTEM RETRIEVAL ANCHOR 12 (MISCELLANEOUS) ×2 IMPLANT
SYSTEM SAHARA CHEST DRAIN ATS (WOUND CARE) ×2 IMPLANT
TAPE CLOTH 4X10 WHT NS (GAUZE/BANDAGES/DRESSINGS) ×2 IMPLANT
TOWEL GREEN STERILE (TOWEL DISPOSABLE) ×4 IMPLANT
TRAY FOLEY MTR SLVR 16FR STAT (SET/KITS/TRAYS/PACK) ×2 IMPLANT
WATER STERILE IRR 1000ML POUR (IV SOLUTION) ×2 IMPLANT

## 2021-04-01 NOTE — Plan of Care (Signed)
  Problem: Education: Goal: Knowledge of General Education information will improve Description: Including pain rating scale, medication(s)/side effects and non-pharmacologic comfort measures Outcome: Progressing   Problem: Health Behavior/Discharge Planning: Goal: Ability to manage health-related needs will improve Outcome: Progressing   Problem: Clinical Measurements: Goal: Will remain free from infection Outcome: Progressing   Problem: Activity: Goal: Risk for activity intolerance will decrease Outcome: Progressing   Problem: Nutrition: Goal: Adequate nutrition will be maintained Outcome: Progressing   Problem: Coping: Goal: Level of anxiety will decrease Outcome: Progressing   Problem: Pain Managment: Goal: General experience of comfort will improve Outcome: Progressing   Problem: Skin Integrity: Goal: Risk for impaired skin integrity will decrease Outcome: Progressing

## 2021-04-01 NOTE — Anesthesia Postprocedure Evaluation (Signed)
Anesthesia Post Note  Patient: Bill [name]  Procedure(s) Performed: XI ROBOTIC ASSISTED THORASCOPY-WEDGE RESECTION & LOWER LOBECTOMY (Right: Chest) INTERCOSTAL NERVE BLOCK (Right: Chest) LYMPH NODE DISSECTION (Right: Chest)     Patient location during evaluation: PACU Anesthesia Type: General Level of consciousness: sedated Pain management: pain level controlled Vital Signs Assessment: post-procedure vital signs reviewed and stable Respiratory status: spontaneous breathing and respiratory function stable Cardiovascular status: stable Postop Assessment: no apparent nausea or vomiting Anesthetic complications: no   No notable events documented.  Last Vitals:  Vitals:   04/01/21 1550 04/01/21 1606  BP: (!) 144/104 140/81  Pulse: (!) 119 (!) 116  Resp: 16 19  Temp:    SpO2: 100% 100%               Jaeleah Smyser DANIEL

## 2021-04-01 NOTE — Progress Notes (Signed)
Transferred -in from PACU by bed awake and alert. Complained of pain on his back, repositioned to sides  and back rub given  with relief. Continue to monitor.

## 2021-04-01 NOTE — Anesthesia Procedure Notes (Signed)
Procedure Name: Intubation Date/Time: 04/01/2021 8:38 AM Performed by: Clearnce Sorrel, CRNA Pre-anesthesia Checklist: Patient identified, Emergency Drugs available, Suction available and Patient being monitored Patient Re-evaluated:Patient Re-evaluated prior to induction Oxygen Delivery Method: Circle System Utilized Preoxygenation: Pre-oxygenation with 100% oxygen Induction Type: IV induction Ventilation: Mask ventilation without difficulty Laryngoscope Size: Mac and 4 Grade View: Grade I Tube type: Oral Endobronchial tube: Left and 39 Fr Number of attempts: 1 Airway Equipment and Method: Stylet and Oral airway Placement Confirmation: ETT inserted through vocal cords under direct vision, positive ETCO2 and breath sounds checked- equal and bilateral Tube secured with: Tape Dental Injury: Teeth and Oropharynx as per pre-operative assessment

## 2021-04-01 NOTE — Transfer of Care (Signed)
Immediate Anesthesia Transfer of Care Note  Patient: Bill [name]  Procedure(s) Performed: XI ROBOTIC ASSISTED THORASCOPY-WEDGE RESECTION & LOWER LOBECTOMY (Right: Chest) INTERCOSTAL NERVE BLOCK (Right: Chest) LYMPH NODE DISSECTION (Right: Chest)  Patient Location: PACU  Anesthesia Type:General  Level of Consciousness: drowsy  Airway & Oxygen Therapy: Patient Spontanous Breathing and Patient connected to face mask oxygen  Post-op Assessment: Report given to RN and Post -op Vital signs reviewed and stable  Post vital signs: Reviewed and stable  Last Vitals:  Vitals Value Taken Time  BP 147/89 04/01/21 1505  Temp    Pulse 110 04/01/21 1512  Resp 20 04/01/21 1512  SpO2 100 % 04/01/21 1512  Vitals shown include unvalidated device data.  Last Pain:  Vitals:   04/01/21 0722  PainSc: 0-No pain      Patients Stated Pain Goal: 0 (76/72/09 [id number])  Complications: No notable events documented.

## 2021-04-01 NOTE — Interval H&P Note (Signed)
History and Physical Interval Note: Stress test Ok. PFts ok 04/01/2021 8:07 AM  Bill [name]  has presented today for surgery, with the diagnosis of RLL LUNG NODULE.  The various methods of treatment have been discussed with the patient and family. After consideration of risks, benefits and other options for treatment, the patient has consented to  Procedure(s): XI ROBOTIC ASSISTED THORASCOPY-WEDGE RESECTION, possible lower lobectomy (Right) as a surgical intervention.  The patient's history has been reviewed, patient examined, no change in status, stable for surgery.  I have reviewed the patient's chart and labs.  Questions were answered to the patient's satisfaction.     [name]

## 2021-04-01 NOTE — Brief Op Note (Signed)
04/01/2021  2:46 PM  PATIENT:  Bill [name]  60 y.o. male  PRE-OPERATIVE DIAGNOSIS:  RIGHT LOWER LOBE LUNG NODULE  POST-OPERATIVE DIAGNOSIS:  RIGHT LOWER LOBE LUNG NODULE  PROCEDURE:  Procedure(s): XI ROBOTIC ASSISTED THORASCOPY-WEDGE RESECTION & LOWER LOBECTOMY (Right) INTERCOSTAL NERVE BLOCK (Right) LYMPH NODE DISSECTION (Right)  SURGEON:  Surgeon(s) and Role:    * [name], MD - Primary  PHYSICIAN ASSISTANT: Arhaan Chesnut PA-C  ANESTHESIA:   general  EBL:  100 mL   BLOOD ADMINISTERED:none  DRAINS: (1 12 F) Blake drain(s) in the RIGHT HEMITHORAX    LOCAL MEDICATIONS USED: EXPAREL  SPECIMEN:  Source of Specimen:  RLL, LN SAMPLES  DISPOSITION OF SPECIMEN:  PATHOLOGY  COUNTS:  YES  TOURNIQUET:  * No tourniquets in log *  DICTATION: .Other Dictation: Dictation Number [id number]  PLAN OF CARE: Admit to inpatient   PATIENT DISPOSITION:  PACU - hemodynamically stable.   Delay start of Pharmacological VTE agent (>24hrs) due to surgical blood loss or risk of bleeding: yes  COMPLICATIONS: NO KNOWN  FROZEN - ADENOCARCINOMA

## 2021-04-02 ENCOUNTER — Encounter (HOSPITAL_COMMUNITY): Payer: Self-pay | Admitting: Thoracic Surgery (Cardiothoracic Vascular Surgery)

## 2021-04-02 ENCOUNTER — Inpatient Hospital Stay (HOSPITAL_COMMUNITY): Payer: BC Managed Care – PPO

## 2021-04-02 LAB — BASIC METABOLIC PANEL
Anion gap: 9 (ref 5–15)
BUN: 18 mg/dL (ref 6–20)
CO2: 25 mmol/L (ref 22–32)
Calcium: 8 mg/dL — ABNORMAL LOW (ref 8.9–10.3)
Chloride: 100 mmol/L (ref 98–111)
Creatinine, Ser: 1.05 mg/dL (ref 0.61–1.24)
GFR, Estimated: >60 mL/min (ref >60)
Glucose, Bld: 183 mg/dL — ABNORMAL HIGH (ref 70–99)
Potassium: 4.2 mmol/L (ref 3.5–5.1)
Sodium: 134 mmol/L — ABNORMAL LOW (ref 135–145)

## 2021-04-02 LAB — CBC
HCT: 37.9 % — ABNORMAL LOW (ref 39.0–52.0)
Hemoglobin: 13.2 g/dL (ref 13.0–17.0)
MCH: 29.9 pg (ref 26.0–34.0)
MCHC: 34.8 g/dL (ref 30.0–36.0)
MCV: 85.7 fL (ref 80.0–100.0)
Platelets: 165 10*3/uL (ref 150–400)
RBC: 4.42 MIL/uL (ref 4.22–5.81)
RDW: 13.6 % (ref 11.5–15.5)
WBC: 10.6 10*3/uL — ABNORMAL HIGH (ref 4.0–10.5)
nRBC: 0 % (ref 0.0–0.2)

## 2021-04-02 LAB — GLUCOSE, CAPILLARY
Glucose-Capillary: 199 mg/dL — ABNORMAL HIGH (ref 70–99)
Glucose-Capillary: 167 mg/dL — ABNORMAL HIGH (ref 70–99)
Glucose-Capillary: 197 mg/dL — ABNORMAL HIGH (ref 70–99)
Glucose-Capillary: 141 mg/dL — ABNORMAL HIGH (ref 70–99)
Glucose-Capillary: 184 mg/dL — ABNORMAL HIGH (ref 70–99)
Glucose-Capillary: 141 mg/dL — ABNORMAL HIGH (ref 70–99)

## 2021-04-02 LAB — HEMOGLOBIN A1C
Hgb A1c MFr Bld: 6 % — ABNORMAL HIGH (ref 4.8–5.6)
Mean Plasma Glucose: 125.5 mg/dL

## 2021-04-02 MED ORDER — INSULIN ASPART 100 UNIT/ML IJ SOLN
0.0000 [IU] | Freq: Three times a day (TID) | INTRAMUSCULAR | Status: DC
  Administered 2021-04-02: 2 [IU] via SUBCUTANEOUS
  Administered 2021-04-02 – 2021-04-03 (×3): 3 [IU] via SUBCUTANEOUS
  Administered 2021-04-03: 2 [IU] via SUBCUTANEOUS
  Administered 2021-04-04 (×3): 5 [IU] via SUBCUTANEOUS
  Administered 2021-04-05: 2 [IU] via SUBCUTANEOUS

## 2021-04-02 MED ORDER — METFORMIN HCL ER 500 MG PO TB24
500.0000 mg | ORAL_TABLET | Freq: Every day | ORAL | Status: DC
  Administered 2021-04-02 – 2021-04-03 (×2): 500 mg via ORAL
  Filled 2021-04-02 (×2): qty 1

## 2021-04-02 MED ORDER — GUAIFENESIN ER 600 MG PO TB12
600.0000 mg | ORAL_TABLET | Freq: Two times a day (BID) | ORAL | Status: DC
  Administered 2021-04-02 – 2021-04-05 (×7): 600 mg via ORAL
  Filled 2021-04-02 (×7): qty 1

## 2021-04-02 MED ORDER — LISINOPRIL 10 MG PO TABS
10.0000 mg | ORAL_TABLET | Freq: Every day | ORAL | Status: DC
  Administered 2021-04-03 – 2021-04-05 (×3): 10 mg via ORAL
  Filled 2021-04-02 (×3): qty 1

## 2021-04-02 NOTE — Op Note (Signed)
NAME: Bill [name], Bill [name] MEDICAL RECORD NO: [id number] ACCOUNT NO: [id number] DATE OF BIRTH: 1961/01/20 FACILITY: [facility] LOCATION: [facility] PHYSICIAN: Revonda Standard. Roxan Hockey, MD  Operative Report   DATE OF PROCEDURE: 04/01/2021   PREOPERATIVE DIAGNOSIS:  Right lower lobe lung nodule.  POSTOPERATIVE DIAGNOSIS:  Adenocarcinoma right lower lobe, clinical stage IIIA (T4, N0).  PROCEDURE PERFORMED:   Robotic-assisted right VATS, Wedge resection with en bloc removal of diaphragm, Right lower lobectomy, Primary repair of diaphragm, Lymph node dissection and Intercostal nerve blocks levels 3 through 10.  SURGEON:  Revonda Standard. Roxan Hockey, MD  ASSISTANT:  Jadene Pierini, PA.  ANESTHESIA: General.  FINDINGS:  Mass densely adherent and likely invading the diaphragm, final diaphragm margins were negative for tumor.  Final bronchial margin negative for tumor.  CLINICAL NOTE:  Mr. Strahle is a 60 year old man with a history of tobacco abuse, who was incidentally found to have a right lower lobe lung mass near the right posterior costophrenic sulcus.  On PET/CT the mass was hypermetabolic.  There was no evidence of  adenopathy.  The patient was advised to undergo surgical resection for definitive diagnosis and treatment with a high probability of this being a lung cancer.  The indications, risks, benefits, and alternatives were discussed in detail with the patient.   He understood and accepted the risks and agreed to proceed.  PROCEDURE NOTE:  Mr. Hulse was brought to the preoperative holding area on 04/01/2021.  Anesthesia established intravenous access and placed an arterial blood pressure monitoring line.  He was taken to the operating room and anesthetized and intubated.   A Foley catheter was placed.  Intravenous antibiotics were administered.  Sequential compression devices were placed on the calves for DVT prophylaxis.  The patient was placed in a left lateral decubitus position.  A Bair Hugger was placed  for active  warming.  The right chest was prepped and draped in the usual sterile fashion.  Single lung ventilation of the left lung was initiated and was tolerated well throughout the procedure.  A timeout was performed.  A solution containing 20 mL of liposomal bupivacaine, 30 mL of 0.5% bupivacaine and 50 mL of saline was prepared.  This was used for local at the incision sites as well as for the intercostal nerve blocks.  An incision was made  in the eighth interspace in the mid axillary line.  An 8 mm robotic port was inserted.  The thoracoscope was advanced into the chest.  After confirming intrapleural placement carbon dioxide was insufflated per protocol. A 12 mm port was placed in the  eighth interspace anterior to the camera port.  Intercostal nerve blocks then were performed from the 3rd to the 10th interspace.  10 mL of the bupivacaine solution was injected into a subpleural plane at each level.  Two additional eighth interspace  ports were placed and then a 12 mm AirSeal port was placed in the tenth interspace posterolaterally.  Inspection of the pleural space showed no pleural effusion.  There was some bleeding around the posterior port site, which was controlled with bipolar  cautery.  There were adhesions of the lung to the diaphragm laterally.  The tumor was evident with some invagination of the visceral pleural surface.  Adhesions were taken down with bipolar cautery.  Initially, there were thin filmy adhesions, but then  there was no clear plane between the lung and the diaphragm.  The decision was made to take piece of the diaphragm with the specimen as there was likely invasion  there.  Bipolar cautery was used to remove the diaphragm, the part removed was approximately  3 x 4 cm and appeared relatively normal grossly.  A wedge resection then was completed with sequential firings of the robotic stapler.  The specimen was placed into an 8 mm retrieval bag, removed, and sent for frozen  section.  Sutures were placed to  orient the diaphragmatic margins.  While awaiting the results of that, the lymph node dissection was initiated.  The inferior ligament was divided.  No node was seen there.  The dissection then was carried up posteriorly dividing the pleural reflection  at the hilum and level 8 and level 7 nodes were removed.  There were multiple level 7 nodes.  The pleural reflection was divided superiorly and level 10 and 4R nodes were removed as well.  The level 4R nodes were relatively large, but appeared otherwise benign.   There was significant bleeding with the lymph node removal.  Surgicel was packed into the level 7 and 4R lymph node beds.  At this point, the frozen section returned and the tumor was an adenocarcinoma.  The anterior and posterior diaphragm margins were  clear, but there was mucoid material at the medial and lateral margins.  This was acellular but there was concern that this could represent residual disease, so additional diaphragm was resected at the medial and lateral margins and sent for frozen  section, both of those eventually returned with no tumor seen.  The pulmonary artery was identified in the fissure.  The fissure was incomplete except for just an area over the basilar segmental branch and more anterior branch.  This was dissected out  and the fissure was ultimately completed anteriorly and posteriorly using the robotic stapler.  Multiple firings were required on both sides.  The superior segmental branch was identified.  The basilar segmental branches were encircled and were divided  with the robotic stapler using a white cartridge.  Level 12 and 13 nodes were removed during the dissection of the vessels.  The inferior pulmonary vein then was encircled and divided using a vascular cartridge on the stapler.  The superior segmental vein branch turned out to have not been included in that staple line.  It was clipped proximally and distally and divided with  bipolar cautery.  The superior segmental artery then was divided with the vascular stapler and finally a stapler was placed across the lower lobe bronchus and closed.  A  test inflation showed good aeration of the upper and middle lobes.  The stapler was fired transecting the lower lobe bronchus.  The specimen then was left in the chest.  Teflon felt pledgets were cut to fit the diaphragmatic defect.  The diaphragm then  was closed primarily using Teflon felt buttresses and 0 V-Loc sutures.  After closing the diaphragm, the robot was undocked.  The anterior eighth interspace incision was lengthened to approximately 4 cm.  A 15 mm endoscopic retrieval bag was placed.  The  lower lobe was placed into that bag and removed.  An attempt was made to remove in a 12 mm bag first, but it would not fit through the incision, so the 15 mm bag was used.  The specimen was sent for frozen section of the bronchial margin, which subsequently  returned with no tumor seen.  All sponges and the vessel loop and all the needles that had been placed during the procedure were removed.  The chest was copiously irrigated with warm saline.  A  test inflation to 20 cm water revealed no leakage from the  bronchial stump.  A 28-French Blake drain was placed in the original port incision and secured with a #1 silk suture.  Dual lung ventilation was resumed.  The remaining incisions were closed in standard fashion.  Dermabond was applied.  The chest  tube was placed to a Pleur-Evac on waterseal.  All sponge, needle and instrument counts were correct at the end of the procedure.  The patient then was extubated in the operating room and taken to the postanesthetic care unit in good condition.     SUJ D: 04/01/2021 6:29:03 pm T: 04/02/2021 2:40:00 am  JOB: [id number]/ [id number]

## 2021-04-02 NOTE — Hospital Course (Addendum)
HPI: Mr. Bill [name] is sent for consultation regarding a right lower lobe lung nodule.   Bill [name] is a 60 year old man with history of tobacco abuse, chronic pain, multiple orthopedic injuries and surgeries, type 2 diabetes, hyperlipidemia, neuropathy, anxiety, and depression.  He is being evaluated for possible kidney stone back in the spring.  A CT showed a mixed density right lower lobe lung lesion.  It measured 3.6 x 3cm.  It was near the posterior right costophrenic sulcus.  Follow-up CT in May showed no change.  More recently he had a PET/CT which showed the nodule was hypermetabolic.  There was no evidence of regional or distant metastatic disease.  He is on gabapentin and methadone for pain.  He does have a pain contract with his primary care.  He had an open sternal fracture repaired in 2018.  He does still have some pain in that area.  He relates an episode about a month ago when he was mowing his grass when he had severe pain in his chest.  He also felt general malaise and extreme fatigue.  He attributed the pain to his sternal injury.  He has not had any further episodes of that nature.  He complains of dizzy spells if he bends over and then stands back up.  He denies shortness of breath, coughing, and wheezing.The patient and all relevant studies were reviewed by Dr. Roxan Hockey who recommended proceeding with surgical intervention.    Hospital Course: Patient underwent a robotic assisted right VATS, wedge resection with en bloc, RLL, repair of diaphragm, lymph node dissection, intercostal nerve block. A line was removed after surgery. Foley and IVF were stopped on post op day one. Chest tube was to water seal and there was no air leak. Chest x rays were taken daily and remained stable. He has a history of diabetes and Metformin XR was restarted. He had complaints of right shoulder pain (he has a history of right rotator cuff injury). K pad was applied.  Pain control remained an issue and his  Toradol dosing was increased.

## 2021-04-02 NOTE — Discharge Summary (Addendum)
Physician Discharge Summary  Patient ID: Bill [name] MRN: [id number] DOB/AGE: Jul 18, 1960 60 y.o.  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   Lung nodule 08/02/2020   Primary osteoarthritis of left knee 12/14/2018   Benign prostatic hyperplasia with weak urinary stream 04/06/2018   Type 2 diabetes mellitus with hyperlipidemia ([place]) 11/07/2017   Neuropathy, arm, left 12/09/2016   Closed fracture of body of sternum with routine healing 11/18/2016   Hepatic steatosis 11/18/2016   Injury of right rotator cuff 11/18/2016   Multiple closed fractures of ribs of both sides with routine healing 11/18/2016   Injury of left rotator cuff 07/19/2016   Controlled substance agreement signed 02/16/2016   Mixed hyperlipidemia 02/16/2016   Anxiety and depression 07/29/2015   Arthritis of both hands 05/13/2015   Colonoscopy refused 04/12/2014   Carpal tunnel syndrome of left wrist 02/12/2014   Screening for depression 11/12/2013   DDD (degenerative disc disease), lumbar 01/11/2013   Low testosterone 01/11/2013    Discharged Condition: stable  Path: Pending at the Time of Discharge  HPI: Bill [name] is sent for consultation regarding a right lower lobe lung nodule.   Bill [name] is a 60 year old man with history of tobacco abuse, chronic pain, multiple orthopedic injuries and surgeries, type 2 diabetes, hyperlipidemia, neuropathy, anxiety, and depression.  He is being evaluated for possible kidney stone back in the spring.  A CT showed a mixed density right lower lobe  lung lesion.  It measured 3.6 x 3cm.  It was near the posterior right costophrenic sulcus.  Follow-up CT in May showed no change.  More recently he had a PET/CT which showed the nodule was hypermetabolic.  There was no evidence of regional or distant metastatic disease.  He is on gabapentin and methadone for pain.  He does have a pain contract with his primary care.  He had an open sternal fracture repaired in 2018.  He does still have some pain in that area.  He relates an episode about a month ago when he was mowing his grass when he had severe pain in his chest.  He also felt general malaise and extreme fatigue.  He attributed the pain to his sternal injury.  He has not had any further episodes of that nature.  He complains of dizzy spells if he bends over and then stands back up.  He denies shortness of breath, coughing, and wheezing.The patient and all relevant studies were reviewed by Dr. Roxan Hockey who recommended proceeding with surgical intervention.    Hospital Course: Patient underwent a robotic assisted right VATS, wedge resection with en bloc, RLL, repair of diaphragm, lymph node dissection, intercostal nerve block. A line was removed after surgery. Foley and IVF were stopped on post op day one. Chest tube was to water seal and there was no air leak.  Chest tube was left on waterseal until postop day 3 when it was removed.  Follow-up chest x-ray was satisfactory.  Chest x rays were taken daily and remained stable. He has a history of diabetes and Metformin XR was restarted. He had  complaints of right shoulder pain (he has a history of right rotator cuff injury). K pad was applied.  Bill [name] was started back on his usual pain regimen resulting in adequate control.  He gradually improved his activity so that he was completely independent with mobility by the time of his discharge.  Diet was also advanced routinely and well-tolerated.  Consults: None  Significant Diagnostic Studies:    CLINICAL DATA:  60 year old male status post lobectomy of the lung.   EXAM: CHEST - 2 VIEW   COMPARISON:  Chest x-ray 04/04/2021.   FINDINGS: Elevation of the right hemidiaphragm. Status post right lower lobectomy. Small right sided pneumothorax, slightly decreased in size compared with yesterday's examination, occupying approximately 5% of the volume of the right hemithorax. No consolidative airspace disease. No pleural effusions. No left pneumothorax. No evidence of pulmonary edema. Heart size is mildly enlarged. Upper mediastinal contours are within normal limits. Small amount of residual subcutaneous emphysema in the right chest wall, similar to the prior study. Orthopedic fixation hardware in the region of the manubrium.   IMPRESSION: 1. Postoperative changes from recent right lower lobectomy with slight decreased size of small right-sided pneumothorax. 2. Mild cardiomegaly.     Electronically Signed   By: Vinnie Langton M.D.   On: 04/05/2021 08:24     Treatments: surgery:  Operative Report    DATE OF PROCEDURE: 04/01/2021     PREOPERATIVE DIAGNOSIS:  Right lower lobe lung nodule.   POSTOPERATIVE DIAGNOSIS:  Adenocarcinoma right lower lobe, clinical stage IIB (T3, N0).   PROCEDURE PERFORMED:  Robotic-assisted right VATS, wedge resection with en bloc, removal of diaphragm, right lower lobectomy, primary repair of diaphragm, lymph node dissection and intercostal nerve blocks levels 3 through 10.   SURGEON:  Revonda Standard. Roxan Hockey, MD   ASSISTANT:  Dr. Jadene Pierini.   ANESTHESIA: General.   FINDINGS:  Mass densely adherent and likely invading the diaphragm, final diaphragm margins were negative for tumor.  Final bronchial margin negative for tumor.   Discharge Exam: Blood pressure 140/82, pulse 73, temperature 97.9 F (36.6 C), temperature source Oral, resp. rate 18, height 5\' 10"  (1.778 m), weight 84.8 kg, SpO2 96 %.  General appearance: alert, cooperative,  and no distress Neurologic: intact Heart: regular rate and rhythm Lungs: diminished breath sounds right abse Wound: clean and dry  Disposition:   Discharged home in stable condition.   Allergies as of 04/05/2021       Reactions   Penicillins Other (See Comments)   Unknown but was told as child has reaction        Medication List     TAKE these medications    atorvastatin 40 MG tablet Commonly known as: LIPITOR Take 40 mg by mouth daily.   DULoxetine 30 MG capsule Commonly known as: CYMBALTA Take 30 mg by mouth daily.   gabapentin 400 MG capsule Commonly known as: NEURONTIN Take 400 mg by mouth 2 (two) times daily.   guaiFENesin 600 MG 12 hr tablet Commonly known as: MUCINEX Take 1 tablet (600 mg total) by mouth 2 (two) times daily. Take if needed for cough or congestion.   lisinopril 10 MG tablet Commonly known as: ZESTRIL Take 10 mg by mouth daily.   Melatonin 5 MG Caps Take 5 mg by mouth at bedtime as needed (sleep).   meloxicam 15 MG tablet Commonly known as: MOBIC Take 1 tablet by mouth daily.   metFORMIN 500 MG 24 hr tablet Commonly known as: GLUCOPHAGE-XR Take 500  mg by mouth in the morning, at noon, and at bedtime.   methadone 10 MG tablet Commonly known as: DOLOPHINE Take 20 mg by mouth in the morning, at noon, and at bedtime.   tadalafil 5 MG tablet Commonly known as: CIALIS Take 1 tablet by mouth daily as needed for erectile dysfunction.   Testosterone Cypionate 200 MG/ML Soln Inject 1 mL as directed every 14 (fourteen) days.   traMADol 50 MG tablet Commonly known as: ULTRAM Take 1-2 tablets by mouth every 6 (six) hours as needed.        Follow-up Information     [name], MD Follow up.   Specialty: Cardiothoracic Surgery Why: Please see discharge paperwork for follow-up appointment with Dr. Roxan Hockey.  Also obtain a chest x-ray at [facility] 1/2-hour prior to appointment.  It is located in the same office  complex on the first floor. Contact information: [address] [place] [address] [phone]                 Signed: Antony Odea, PA-C  04/05/2021, 9:51 AM

## 2021-04-02 NOTE — Progress Notes (Addendum)
      [address]       [place] [address]             [phone]       1 Day Post-Op Procedure(s) (LRB): XI ROBOTIC ASSISTED THORASCOPY-WEDGE RESECTION & LOWER LOBECTOMY (Right) INTERCOSTAL NERVE BLOCK (Right) LYMPH NODE DISSECTION (Right)  Subjective: Patient eating breakfast this am. He has pain in right shoulder, He denies nausea.  Objective: Vital signs in last 24 hours: Temp:  [97 F (36.1 C)-98.8 F (37.1 C)] 98.5 F (36.9 C) (10/06 0300) Pulse Rate:  [88-119] 98 (10/06 0600) Cardiac Rhythm: Normal sinus rhythm (10/05 1906) Resp:  [11-25] 25 (10/06 0600) BP: (105-165)/(61-104) 118/65 (10/06 0600) SpO2:  [92 %-100 %] 96 % (10/06 0600) Arterial Line BP: (146-160)/(79-81) 146/81 (10/05 1550) Weight:  [84.8 kg] 84.8 kg (10/05 0727)     Intake/Output from previous day: 10/05 0701 - 10/06 0700 In: 2450 [I.V.:2450] Out: 2400 [Urine:2060; Blood:100; Chest Tube:240]   Physical Exam:  Cardiovascular: RRR Pulmonary: Clear to auscultation on the left and slightly diminished right base Abdomen: Soft, non tender, bowel sounds present. Extremities: SCDs Wounds: Clean and dry.  No erythema or signs of infection. Chest tube with sero sanguinous drainage Chest Tube: to water seal, no air leak  Lab Results: CBC: Recent Labs    03/30/21 1339 04/02/21 0113  WBC 8.1 10.6*  HGB 14.1 13.2  HCT 42.7 37.9*  PLT 194 165   BMET:  Recent Labs    03/30/21 1339 04/02/21 0113  NA 135 134*  K 4.4 4.2  CL 103 100  CO2 23 25  GLUCOSE 159* 183*  BUN 12 18  CREATININE 0.97 1.05  CALCIUM 8.9 8.0*    PT/INR:  Recent Labs    03/30/21 1339  LABPROT 14.2  INR 1.1   ABG:  INR: Will add last result for INR, ABG once components are confirmed Will add last 4 CBG results once components are confirmed  Assessment/Plan:  1. CV - SR. Will restart Lisinopril in am if BP allows 2.  Pulmonary - On 2 liters of oxygen via [place]. Chest tubes to water seal. No  air leak Chest tube with 240 cc since surgery. CXR this am appears stable (right chest wall emphysema, trace right apical pneumothorax). Chest tube to remain for now.Encourage incentive spirometer. Mucinex for cough. Await final pathology. 3. DM-CBGs 152/167/197. Will restart Metformin 500 mg XR 4. Regarding right shoulder pain, patient with a history of rotator cuff injury. This along with positioning during surgery has aggravated his pain. Try heating pad, pain medication PRN. Could consider muscle relaxant or Lidocaine patch 5. Remove foley, stop IVF 6. Has a controlled substance contract;on Methadone  Donielle M ZimmermanPA-C 04/02/2021,7:02 AM [phone]  Patient seen and examined, agree with above No air leak but a fair amount of tidal movement- will leave tube on water seal today Ambulate   Remo Lipps C. Roxan Hockey, MD [facility] [phone]

## 2021-04-03 ENCOUNTER — Inpatient Hospital Stay (HOSPITAL_COMMUNITY): Payer: BC Managed Care – PPO

## 2021-04-03 LAB — CBC
HCT: 36.9 % — ABNORMAL LOW (ref 39.0–52.0)
Hemoglobin: 12 g/dL — ABNORMAL LOW (ref 13.0–17.0)
MCH: 28.6 pg (ref 26.0–34.0)
MCHC: 32.5 g/dL (ref 30.0–36.0)
MCV: 88.1 fL (ref 80.0–100.0)
Platelets: 150 10*3/uL (ref 150–400)
RBC: 4.19 MIL/uL — ABNORMAL LOW (ref 4.22–5.81)
RDW: 13.9 % (ref 11.5–15.5)
WBC: 8.6 10*3/uL (ref 4.0–10.5)
nRBC: 0 % (ref 0.0–0.2)

## 2021-04-03 LAB — GLUCOSE, CAPILLARY
Glucose-Capillary: 192 mg/dL — ABNORMAL HIGH (ref 70–99)
Glucose-Capillary: 189 mg/dL — ABNORMAL HIGH (ref 70–99)
Glucose-Capillary: 331 mg/dL — ABNORMAL HIGH (ref 70–99)

## 2021-04-03 LAB — COMPREHENSIVE METABOLIC PANEL
ALT: 36 U/L (ref 0–44)
AST: 42 U/L — ABNORMAL HIGH (ref 15–41)
Albumin: 2.9 g/dL — ABNORMAL LOW (ref 3.5–5.0)
Alkaline Phosphatase: 68 U/L (ref 38–126)
Anion gap: 5 (ref 5–15)
BUN: 14 mg/dL (ref 6–20)
CO2: 26 mmol/L (ref 22–32)
Calcium: 7.8 mg/dL — ABNORMAL LOW (ref 8.9–10.3)
Chloride: 101 mmol/L (ref 98–111)
Creatinine, Ser: 0.87 mg/dL (ref 0.61–1.24)
GFR, Estimated: >60 mL/min (ref >60)
Glucose, Bld: 187 mg/dL — ABNORMAL HIGH (ref 70–99)
Potassium: 3.6 mmol/L (ref 3.5–5.1)
Sodium: 132 mmol/L — ABNORMAL LOW (ref 135–145)
Total Bilirubin: 0.7 mg/dL (ref 0.3–1.2)
Total Protein: 5.5 g/dL — ABNORMAL LOW (ref 6.5–8.1)

## 2021-04-03 MED ORDER — KETOROLAC TROMETHAMINE 30 MG/ML IJ SOLN
30.0000 mg | Freq: Four times a day (QID) | INTRAMUSCULAR | Status: AC
  Administered 2021-04-03 – 2021-04-05 (×7): 30 mg via INTRAVENOUS
  Filled 2021-04-03 (×7): qty 1

## 2021-04-03 MED ORDER — METFORMIN HCL ER 500 MG PO TB24
500.0000 mg | ORAL_TABLET | Freq: Two times a day (BID) | ORAL | Status: DC
  Administered 2021-04-03 – 2021-04-05 (×4): 500 mg via ORAL
  Filled 2021-04-03 (×5): qty 1

## 2021-04-03 NOTE — Progress Notes (Signed)
2 Days Post-Op Procedure(s) (LRB): XI ROBOTIC ASSISTED THORASCOPY-WEDGE RESECTION & LOWER LOBECTOMY (Right) INTERCOSTAL NERVE BLOCK (Right) LYMPH NODE DISSECTION (Right) Subjective: C/o incisional pain  Objective: Vital signs in last 24 hours: Temp:  [97.6 F (36.4 C)-97.9 F (36.6 C)] 97.6 F (36.4 C) (10/07 0309) Pulse Rate:  [84-102] 84 (10/07 0309) Cardiac Rhythm: Normal sinus rhythm (10/07 0710) Resp:  [18-26] 19 (10/07 0309) BP: (94-128)/(63-77) 119/70 (10/07 0309) SpO2:  [95 %-100 %] 99 % (10/07 0309)  Hemodynamic parameters for last 24 hours:    Intake/Output from previous day: 10/06 0701 - 10/07 0700 In: 1912 [P.O.:1912] Out: 1670 [Urine:1110; Chest Tube:560] Intake/Output this shift: No intake/output data recorded.  General appearance: alert, cooperative, and no distress Neurologic: intact Heart: regular rate and rhythm Lungs: diminished breath sounds right base Wound: clean + small air leak with large tidal variation  Lab Results: Recent Labs    04/02/21 0113 04/03/21 0130  WBC 10.6* 8.6  HGB 13.2 12.0*  HCT 37.9* 36.9*  PLT 165 150   BMET:  Recent Labs    04/02/21 0113 04/03/21 0130  NA 134* 132*  K 4.2 3.6  CL 100 101  CO2 25 26  GLUCOSE 183* 187*  BUN 18 14  CREATININE 1.05 0.87  CALCIUM 8.0* 7.8*    PT/INR: No results for input(s): LABPROT, INR in the last 72 hours. ABG    Component Value Date/Time   PHART 7.369 03/30/2021 1339   HCO3 24.4 03/30/2021 1339   ACIDBASEDEF 0.2 03/30/2021 1339   O2SAT 97.7 03/30/2021 1339   CBG (last 3)  Recent Labs    04/02/21 1552 04/02/21 2113 04/03/21 0601  GLUCAP 184* 141* 192*    Assessment/Plan: S/P Procedure(s) (LRB): XI ROBOTIC ASSISTED THORASCOPY-WEDGE RESECTION & LOWER LOBECTOMY (Right) INTERCOSTAL NERVE BLOCK (Right) LYMPH NODE DISSECTION (Right) POD # 2 Overall looks good. Pain control about as good as can be expected  Will increase toradol to 30 mg q6 Keep tube on water  seal SCD + enoxaparin Needs to ambulate    LOS: 2 days    [name] 04/03/2021

## 2021-04-03 NOTE — Plan of Care (Signed)
  Problem: Education: Goal: Knowledge of General Education information will improve Description: Including pain rating scale, medication(s)/side effects and non-pharmacologic comfort measures Outcome: Progressing   Problem: Health Behavior/Discharge Planning: Goal: Ability to manage health-related needs will improve Outcome: Progressing   Problem: Clinical Measurements: Goal: Ability to maintain clinical measurements within normal limits will improve Outcome: Progressing Goal: Will remain free from infection Outcome: Progressing Goal: Respiratory complications will improve Outcome: Progressing Goal: Cardiovascular complication will be avoided Outcome: Progressing   Problem: Activity: Goal: Risk for activity intolerance will decrease Outcome: Progressing   Problem: Nutrition: Goal: Adequate nutrition will be maintained Outcome: Progressing   Problem: Coping: Goal: Level of anxiety will decrease Outcome: Progressing   Problem: Elimination: Goal: Will not experience complications related to bowel motility Outcome: Progressing Goal: Will not experience complications related to urinary retention Outcome: Progressing   Problem: Pain Managment: Goal: General experience of comfort will improve Outcome: Progressing   Problem: Safety: Goal: Ability to remain free from injury will improve Outcome: Progressing   Problem: Skin Integrity: Goal: Risk for impaired skin integrity will decrease Outcome: Progressing   Problem: Activity: Goal: Risk for activity intolerance will decrease Outcome: Progressing   Problem: Cardiac: Goal: Will achieve and/or maintain hemodynamic stability Outcome: Progressing   Problem: Clinical Measurements: Goal: Postoperative complications will be avoided or minimized Outcome: Progressing   Problem: Respiratory: Goal: Respiratory status will improve Outcome: Progressing   Problem: Pain Management: Goal: Pain level will decrease Outcome:  Progressing

## 2021-04-04 ENCOUNTER — Inpatient Hospital Stay (HOSPITAL_COMMUNITY): Payer: BC Managed Care – PPO

## 2021-04-04 LAB — GLUCOSE, CAPILLARY
Glucose-Capillary: 213 mg/dL — ABNORMAL HIGH (ref 70–99)
Glucose-Capillary: 205 mg/dL — ABNORMAL HIGH (ref 70–99)
Glucose-Capillary: 244 mg/dL — ABNORMAL HIGH (ref 70–99)
Glucose-Capillary: 209 mg/dL — ABNORMAL HIGH (ref 70–99)

## 2021-04-04 LAB — BASIC METABOLIC PANEL
Anion gap: 5 (ref 5–15)
BUN: 9 mg/dL (ref 6–20)
CO2: 26 mmol/L (ref 22–32)
Calcium: 8.3 mg/dL — ABNORMAL LOW (ref 8.9–10.3)
Chloride: 104 mmol/L (ref 98–111)
Creatinine, Ser: 0.78 mg/dL (ref 0.61–1.24)
GFR, Estimated: >60 mL/min (ref >60)
Glucose, Bld: 208 mg/dL — ABNORMAL HIGH (ref 70–99)
Potassium: 4 mmol/L (ref 3.5–5.1)
Sodium: 135 mmol/L (ref 135–145)

## 2021-04-04 MED ORDER — IPRATROPIUM-ALBUTEROL 0.5-2.5 (3) MG/3ML IN SOLN
3.0000 mL | Freq: Two times a day (BID) | RESPIRATORY_TRACT | Status: DC
  Administered 2021-04-04: 3 mL via RESPIRATORY_TRACT
  Filled 2021-04-04: qty 3

## 2021-04-04 MED ORDER — IPRATROPIUM-ALBUTEROL 0.5-2.5 (3) MG/3ML IN SOLN: 3.0000 mL | RESPIRATORY_TRACT | Status: DC | PRN

## 2021-04-04 NOTE — Plan of Care (Signed)
  Problem: Education: Goal: Knowledge of General Education information will improve Description: Including pain rating scale, medication(s)/side effects and non-pharmacologic comfort measures Outcome: Progressing   Problem: Health Behavior/Discharge Planning: Goal: Ability to manage health-related needs will improve Outcome: Progressing   Problem: Clinical Measurements: Goal: Ability to maintain clinical measurements within normal limits will improve Outcome: Progressing Goal: Will remain free from infection Outcome: Progressing Goal: Diagnostic test results will improve Outcome: Progressing Goal: Respiratory complications will improve Outcome: Progressing Goal: Cardiovascular complication will be avoided Outcome: Progressing   Problem: Activity: Goal: Risk for activity intolerance will decrease Outcome: Progressing   Problem: Nutrition: Goal: Adequate nutrition will be maintained Outcome: Progressing   Problem: Coping: Goal: Level of anxiety will decrease Outcome: Progressing   Problem: Elimination: Goal: Will not experience complications related to urinary retention Outcome: Progressing   Problem: Pain Managment: Goal: General experience of comfort will improve Outcome: Progressing   Problem: Safety: Goal: Ability to remain free from injury will improve Outcome: Progressing   Problem: Skin Integrity: Goal: Risk for impaired skin integrity will decrease Outcome: Progressing   Problem: Education: Goal: Knowledge of disease or condition will improve Outcome: Progressing Goal: Knowledge of the prescribed therapeutic regimen will improve Outcome: Progressing   Problem: Activity: Goal: Risk for activity intolerance will decrease Outcome: Progressing   Problem: Cardiac: Goal: Will achieve and/or maintain hemodynamic stability Outcome: Progressing   Problem: Clinical Measurements: Goal: Postoperative complications will be avoided or minimized Outcome:  Progressing   Problem: Respiratory: Goal: Respiratory status will improve Outcome: Progressing   Problem: Pain Management: Goal: Pain level will decrease Outcome: Progressing   Problem: Skin Integrity: Goal: Wound healing without signs and symptoms infection will improve Outcome: Progressing  Problem: Activity: Goal: Risk for activity intolerance will decrease Outcome: Progressing   Problem: Cardiac: Goal: Will achieve and/or maintain hemodynamic stability Outcome: Progressing   Problem: Clinical Measurements: Goal: Postoperative complications will be avoided or minimized Outcome: Progressing   Problem: Respiratory: Goal: Respiratory status will improve Outcome: Progressing   Problem: Pain Management: Goal: Pain level will decrease Outcome: Progressing   Problem: Skin Integrity: Goal: Wound healing without signs and symptoms infection will improve Outcome: Progressing

## 2021-04-04 NOTE — Progress Notes (Signed)
3 Days Post-Op Procedure(s) (LRB): XI ROBOTIC ASSISTED THORASCOPY-WEDGE RESECTION & LOWER LOBECTOMY (Right) INTERCOSTAL NERVE BLOCK (Right) LYMPH NODE DISSECTION (Right) Subjective: Feels much better today  Objective: Vital signs in last 24 hours: Temp:  [97.5 F (36.4 C)-98.1 F (36.7 C)] 97.6 F (36.4 C) (10/08 0720) Pulse Rate:  [75-92] 75 (10/08 0340) Cardiac Rhythm: Normal sinus rhythm (10/08 0721) Resp:  [14-22] 17 (10/08 0720) BP: (99-111)/(63-73) 104/73 (10/08 0720) SpO2:  [95 %-100 %] 99 % (10/08 0724)  Hemodynamic parameters for last 24 hours:    Intake/Output from previous day: 10/07 0701 - 10/08 0700 In: 472 [P.O.:472] Out: 670 [Urine:400; Chest Tube:270] Intake/Output this shift: Total I/O In: -  Out: 20 [Chest Tube:20]  General appearance: alert, cooperative, and no distress Neurologic: intact Heart: regular rate and rhythm Lungs: clear to auscultation bilaterally Tidal variation but no air leak this AM  Lab Results: Recent Labs    04/02/21 0113 04/03/21 0130  WBC 10.6* 8.6  HGB 13.2 12.0*  HCT 37.9* 36.9*  PLT 165 150   BMET:  Recent Labs    04/03/21 0130 04/04/21 0111  NA 132* 135  K 3.6 4.0  CL 101 104  CO2 26 26  GLUCOSE 187* 208*  BUN 14 9  CREATININE 0.87 0.78  CALCIUM 7.8* 8.3*    PT/INR: No results for input(s): LABPROT, INR in the last 72 hours. ABG    Component Value Date/Time   PHART 7.369 03/30/2021 1339   HCO3 24.4 03/30/2021 1339   ACIDBASEDEF 0.2 03/30/2021 1339   O2SAT 97.7 03/30/2021 1339   CBG (last 3)  Recent Labs    04/03/21 1703 04/03/21 2106 04/04/21 0603  GLUCAP 189* 331* 213*    Assessment/Plan: S/P Procedure(s) (LRB): XI ROBOTIC ASSISTED THORASCOPY-WEDGE RESECTION & LOWER LOBECTOMY (Right) INTERCOSTAL NERVE BLOCK (Right) LYMPH NODE DISSECTION (Right) POD # 3 Looks great this AM No air leak- dc chest tube Path still pending CBG elevated yesterday afternoon- says he was drinking a lot of fruit  juices On metformin, continue SSI   LOS: 3 days    Bill [name] 04/04/2021

## 2021-04-05 ENCOUNTER — Inpatient Hospital Stay (HOSPITAL_COMMUNITY): Payer: BC Managed Care – PPO

## 2021-04-05 LAB — GLUCOSE, CAPILLARY: Glucose-Capillary: 126 mg/dL — ABNORMAL HIGH (ref 70–99)

## 2021-04-05 MED ORDER — GUAIFENESIN ER 600 MG PO TB12: 600.0000 mg | ORAL_TABLET | Freq: Two times a day (BID) | ORAL | Status: AC

## 2021-04-05 MED ORDER — MELOXICAM 7.5 MG PO TABS
15.0000 mg | ORAL_TABLET | Freq: Every day | ORAL | Status: DC
  Administered 2021-04-05: 15 mg via ORAL
  Filled 2021-04-05: qty 2

## 2021-04-05 NOTE — Discharge Instructions (Signed)

## 2021-04-05 NOTE — Plan of Care (Signed)
  Problem: Education: Goal: Knowledge of General Education information will improve Description: Including pain rating scale, medication(s)/side effects and non-pharmacologic comfort measures Outcome: Progressing   Problem: Health Behavior/Discharge Planning: Goal: Ability to manage health-related needs will improve Outcome: Progressing   Problem: Clinical Measurements: Goal: Ability to maintain clinical measurements within normal limits will improve Outcome: Progressing   Problem: Activity: Goal: Risk for activity intolerance will decrease Outcome: Progressing   Problem: Pain Managment: Goal: General experience of comfort will improve Outcome: Progressing   Problem: Skin Integrity: Goal: Risk for impaired skin integrity will decrease Outcome: Progressing   

## 2021-04-05 NOTE — Progress Notes (Signed)
4 Days Post-Op Procedure(s) (LRB): XI ROBOTIC ASSISTED THORASCOPY-WEDGE RESECTION & LOWER LOBECTOMY (Right) INTERCOSTAL NERVE BLOCK (Right) LYMPH NODE DISSECTION (Right) Subjective: Some incisional pain   Objective: Vital signs in last 24 hours: Temp:  [97.6 F (36.4 C)-98.2 F (36.8 C)] 97.9 F (36.6 C) (10/09 0744) Pulse Rate:  [73-90] 73 (10/09 0316) Cardiac Rhythm: Normal sinus rhythm (10/09 0809) Resp:  [13-21] 18 (10/09 0744) BP: (93-140)/(64-82) 140/82 (10/09 0744) SpO2:  [96 %-99 %] 96 % (10/09 0316)  Hemodynamic parameters for last 24 hours:    Intake/Output from previous day: 10/08 0701 - 10/09 0700 In: 240 [P.O.:240] Out: 70 [Chest Tube:70] Intake/Output this shift: No intake/output data recorded.  General appearance: alert, cooperative, and no distress Neurologic: intact Heart: regular rate and rhythm Lungs: diminished breath sounds right abse Wound: clean and dry  Lab Results: Recent Labs    04/03/21 0130  WBC 8.6  HGB 12.0*  HCT 36.9*  PLT 150   BMET:  Recent Labs    04/03/21 0130 04/04/21 0111  NA 132* 135  K 3.6 4.0  CL 101 104  CO2 26 26  GLUCOSE 187* 208*  BUN 14 9  CREATININE 0.87 0.78  CALCIUM 7.8* 8.3*    PT/INR: No results for input(s): LABPROT, INR in the last 72 hours. ABG    Component Value Date/Time   PHART 7.369 03/30/2021 1339   HCO3 24.4 03/30/2021 1339   ACIDBASEDEF 0.2 03/30/2021 1339   O2SAT 97.7 03/30/2021 1339   CBG (last 3)  Recent Labs    04/04/21 1719 04/04/21 2118 04/05/21 0741  GLUCAP 244* 209* 126*    Assessment/Plan: S/P Procedure(s) (LRB): XI ROBOTIC ASSISTED THORASCOPY-WEDGE RESECTION & LOWER LOBECTOMY (Right) INTERCOSTAL NERVE BLOCK (Right) LYMPH NODE DISSECTION (Right) Plan for discharge: see discharge orders Doing well Will dc home today Will restart Mobic Continue methadone + PRN tramadol Path still pending   LOS: 4 days    [name] 04/05/2021

## 2021-04-08 LAB — SURGICAL PATHOLOGY

## 2021-04-14 ENCOUNTER — Ambulatory Visit: Payer: BC Managed Care – PPO

## 2021-04-15 ENCOUNTER — Other Ambulatory Visit: Payer: Self-pay

## 2021-04-15 ENCOUNTER — Ambulatory Visit
Admission: RE | Admit: 2021-04-15 | Discharge: 2021-04-15 | Disposition: A | Discharge status: Home / Self Care | Payer: BC Managed Care – PPO | Source: Ambulatory Visit | Attending: Thoracic Surgery (Cardiothoracic Vascular Surgery) | Admitting: Thoracic Surgery (Cardiothoracic Vascular Surgery)

## 2021-04-15 ENCOUNTER — Other Ambulatory Visit: Payer: Self-pay | Admitting: Thoracic Surgery (Cardiothoracic Vascular Surgery)

## 2021-04-15 ENCOUNTER — Encounter: Payer: Self-pay | Admitting: Thoracic Surgery (Cardiothoracic Vascular Surgery)

## 2021-04-15 ENCOUNTER — Ambulatory Visit (INDEPENDENT_AMBULATORY_CARE_PROVIDER_SITE_OTHER): Payer: Self-pay | Admitting: Thoracic Surgery (Cardiothoracic Vascular Surgery)

## 2021-04-15 VITALS — BP 125/76 | HR 112 | Resp 20 | Wt 193.0 lb

## 2021-04-15 DIAGNOSIS — R911 Solitary pulmonary nodule: Secondary | ICD-10-CM

## 2021-04-15 DIAGNOSIS — Z902 Acquired absence of lung [part of]: Secondary | ICD-10-CM

## 2021-04-15 MED ORDER — ALBUTEROL SULFATE HFA 108 (90 BASE) MCG/ACT IN AERS: 2.0000 | INHALATION_SPRAY | Freq: Four times a day (QID) | RESPIRATORY_TRACT | 2 refills | Status: AC | PRN

## 2021-04-15 NOTE — Progress Notes (Signed)
[address]       [place] [address]             [phone]     HPI: 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  His postoperative course was uncomplicated and he went home on day 4.  He does have some incisional pain.  His primary complaint is that he cannot lay on his right side to sleep.  He is only taking the baseline medications that he was on prior to surgery.  He does not feel as well today as he has previously.  Complains of feeling a little more tired and a little more short of breath.  No swelling in his legs.  He did have swelling in his legs when he first got home but that resolved after a few days.   Past Medical History:  Diagnosis Date   Arthritis    Diabetes mellitus ([place])    History of kidney stones    Hyperlipidemia    Hypertension    Lung mass    Tobacco abuse      Current Outpatient Medications  Medication Sig Dispense Refill   atorvastatin (LIPITOR) 40 MG tablet Take 40 mg by mouth daily.     DULoxetine (CYMBALTA) 30 MG capsule Take 30 mg by mouth daily.     gabapentin (NEURONTIN) 400 MG capsule Take 400 mg by mouth 2 (two) times daily.     guaiFENesin (MUCINEX) 600 MG 12 hr tablet Take 1 tablet (600 mg total) by mouth 2 (two) times daily. Take if needed for cough or congestion.     lisinopril (ZESTRIL) 10 MG tablet Take 10 mg by mouth daily.     Melatonin 5 MG CAPS Take 5 mg by mouth at bedtime as needed (sleep).     meloxicam (MOBIC) 15 MG tablet Take 1 tablet by mouth daily.     metFORMIN (GLUCOPHAGE-XR) 500 MG 24 hr tablet Take 500 mg by mouth in the morning, at noon, and at bedtime.     methadone (DOLOPHINE) 10 MG tablet Take 20 mg by mouth in the morning, at noon, and at bedtime.     tadalafil (CIALIS) 5 MG tablet Take 1 tablet by mouth  daily as needed for erectile dysfunction.     Testosterone Cypionate 200 MG/ML SOLN Inject 1 mL as directed every 14 (fourteen) days.     traMADol (ULTRAM) 50 MG tablet Take 1-2 tablets by mouth every 6 (six) hours as needed.     No current facility-administered medications for this visit.    Physical Exam BP 125/76 (BP Location: Left Arm, Patient Position: Sitting)   Pulse (!) 112   Resp 20   Wt 193 lb (87.5 kg)   SpO2 97% Comment: ra  BMI 27.68 kg/m  60 year old man in no acute distress Alert and oriented x3 with no focal deficits Incisions clean dry and intact Lungs diminished breath sounds at right base with faint wheezing bilaterally Cardiac mildly tachycardic, regular No peripheral edema  Diagnostic Tests: CHEST - 2 VIEW   COMPARISON:  Chest x-ray 04/05/2021.   FINDINGS: There is a small/moderate right pleural effusion. The left lung is clear. There is no evidence for pneumothorax. Sternotomy fixation plate present. The cardiomediastinal silhouette is within normal limits. No acute fractures are  seen.   IMPRESSION: 1. Small/moderate right pleural effusion. Cannot exclude underlying atelectasis/airspace disease in the right lower hemithorax.     Electronically Signed   By: Ronney Asters M.D.   On: 04/15/2021 15:41 I personally reviewed the chest x-ray images.  There is postoperative volume loss due to the lobectomy.  In addition there appears to be some pleural effusion with small to moderate.  Impression: Bill [name] is a 60 year old man who is now 2 weeks out from a robotic right lower lobectomy with en bloc resection of a portion of the diaphragm due to tumor invasion.  He did well initially went home on day 4.  He continues to do well.  He is not feeling as well today.  His heart rate is a little elevated but regular.  He does not have any peripheral edema or calf tenderness to suggest DVT.  He is not tachypneic and his oxygen saturation is 97% on room air.  He  does have a pleural effusion on the right.  It is hard to tell precisely how much of that is attributable to elevation of the diaphragm and how much is actual pleural effusion.  Overall he still looks well all things considered.  He does have an indication for adjuvant therapy.  I am going to make referral to Dr. Julien Nordmann at our multidisciplinary thoracic oncology clinic.  Hopefully he can see him in 2 weeks.  I will see him back in 2 weeks as well to check on the left pleural effusion and see if he needs a thoracentesis.  He is back on his baseline pain medication dosing.  Not requiring any supplements from his surgery.  Plan: Referral to Dr. Julien Nordmann Return in 2 weeks with PA lateral chest x-ray  [name], MD [facility] [phone]

## 2021-04-16 ENCOUNTER — Telehealth: Payer: Self-pay | Admitting: *Deleted

## 2021-04-16 ENCOUNTER — Encounter: Payer: Self-pay | Admitting: *Deleted

## 2021-04-16 ENCOUNTER — Other Ambulatory Visit: Payer: Self-pay | Admitting: *Deleted

## 2021-04-16 NOTE — Telephone Encounter (Signed)
I received referral on Bill [name] today. I called and updated on his appt at [facility] on 04/30/21.

## 2021-04-16 NOTE — Progress Notes (Signed)
The proposed treatment discussed in conference is for discussion purpose only and is not a binding recommendation. The patient was not been physically examined, or presented with their treatment options. Therefore, final treatment plans cannot be decided.  

## 2021-04-16 NOTE — Progress Notes (Signed)
Per Dr. Julien Nordmann, pathology notified to send recent tissue to [facility] for molecular and PDL 1.

## 2021-04-27 ENCOUNTER — Encounter (HOSPITAL_COMMUNITY): Payer: Self-pay

## 2021-04-29 ENCOUNTER — Other Ambulatory Visit: Payer: Self-pay | Admitting: Thoracic Surgery (Cardiothoracic Vascular Surgery)

## 2021-04-29 ENCOUNTER — Other Ambulatory Visit: Payer: Self-pay | Admitting: Physician Assistant

## 2021-04-29 DIAGNOSIS — R911 Solitary pulmonary nodule: Secondary | ICD-10-CM

## 2021-04-30 ENCOUNTER — Encounter: Payer: Self-pay | Admitting: Thoracic Surgery (Cardiothoracic Vascular Surgery)

## 2021-04-30 ENCOUNTER — Ambulatory Visit
Admission: RE | Admit: 2021-04-30 | Discharge: 2021-04-30 | Disposition: A | Discharge status: Home / Self Care | Payer: BC Managed Care – PPO | Source: Ambulatory Visit | Attending: Thoracic Surgery (Cardiothoracic Vascular Surgery) | Admitting: Thoracic Surgery (Cardiothoracic Vascular Surgery)

## 2021-04-30 ENCOUNTER — Other Ambulatory Visit: Payer: Self-pay

## 2021-04-30 ENCOUNTER — Ambulatory Visit (INDEPENDENT_AMBULATORY_CARE_PROVIDER_SITE_OTHER): Payer: Self-pay | Admitting: Thoracic Surgery (Cardiothoracic Vascular Surgery)

## 2021-04-30 ENCOUNTER — Inpatient Hospital Stay: Payer: BC Managed Care – PPO | Attending: Internal Medicine | Admitting: Internal Medicine

## 2021-04-30 ENCOUNTER — Inpatient Hospital Stay: Payer: BC Managed Care – PPO

## 2021-04-30 ENCOUNTER — Encounter (HOSPITAL_COMMUNITY): Payer: Self-pay

## 2021-04-30 VITALS — BP 107/64 | HR 83 | Resp 20 | Ht 70.0 in | Wt 184.0 lb

## 2021-04-30 VITALS — BP 110/68 | HR 92 | Temp 97.6°F | Resp 18 | Ht 70.0 in | Wt 184.9 lb

## 2021-04-30 DIAGNOSIS — C3491 Malignant neoplasm of unspecified part of right bronchus or lung: Secondary | ICD-10-CM

## 2021-04-30 DIAGNOSIS — Z5111 Encounter for antineoplastic chemotherapy: Secondary | ICD-10-CM

## 2021-04-30 DIAGNOSIS — R911 Solitary pulmonary nodule: Secondary | ICD-10-CM

## 2021-04-30 DIAGNOSIS — C3490 Malignant neoplasm of unspecified part of unspecified bronchus or lung: Secondary | ICD-10-CM

## 2021-04-30 DIAGNOSIS — Z87891 Personal history of nicotine dependence: Secondary | ICD-10-CM | POA: Diagnosis not present

## 2021-04-30 DIAGNOSIS — Z902 Acquired absence of lung [part of]: Secondary | ICD-10-CM | POA: Insufficient documentation

## 2021-04-30 DIAGNOSIS — C3431 Malignant neoplasm of lower lobe, right bronchus or lung: Secondary | ICD-10-CM | POA: Insufficient documentation

## 2021-04-30 LAB — CMP (CANCER CENTER ONLY)
ALT: 16 U/L (ref 0–44)
AST: 15 U/L (ref 15–41)
Albumin: 3.7 g/dL (ref 3.5–5.0)
Alkaline Phosphatase: 123 U/L (ref 38–126)
Anion gap: 9 (ref 5–15)
BUN: 21 mg/dL — ABNORMAL HIGH (ref 6–20)
CO2: 27 mmol/L (ref 22–32)
Calcium: 9.5 mg/dL (ref 8.9–10.3)
Chloride: 101 mmol/L (ref 98–111)
Creatinine: 1.07 mg/dL (ref 0.61–1.24)
GFR, Estimated: >60 mL/min (ref >60)
Glucose, Bld: 142 mg/dL — ABNORMAL HIGH (ref 70–99)
Potassium: 5.2 mmol/L — ABNORMAL HIGH (ref 3.5–5.1)
Sodium: 137 mmol/L (ref 135–145)
Total Bilirubin: 0.4 mg/dL (ref 0.3–1.2)
Total Protein: 7.5 g/dL (ref 6.5–8.1)

## 2021-04-30 LAB — CBC WITH DIFFERENTIAL (CANCER CENTER ONLY)
Abs Immature Granulocytes: 0.03 10*3/uL (ref 0.00–0.07)
Basophils Absolute: 0.1 10*3/uL (ref 0.0–0.1)
Basophils Relative: 1 %
Eosinophils Absolute: 0.5 10*3/uL (ref 0.0–0.5)
Eosinophils Relative: 5 %
HCT: 38.1 % — ABNORMAL LOW (ref 39.0–52.0)
Hemoglobin: 12.5 g/dL — ABNORMAL LOW (ref 13.0–17.0)
Immature Granulocytes: 0 %
Lymphocytes Relative: 20 %
Lymphs Abs: 2 10*3/uL (ref 0.7–4.0)
MCH: 27.4 pg (ref 26.0–34.0)
MCHC: 32.8 g/dL (ref 30.0–36.0)
MCV: 83.6 fL (ref 80.0–100.0)
Monocytes Absolute: 0.8 10*3/uL (ref 0.1–1.0)
Monocytes Relative: 8 %
Neutro Abs: 6.5 10*3/uL (ref 1.7–7.7)
Neutrophils Relative %: 66 %
Platelet Count: 261 10*3/uL (ref 150–400)
RBC: 4.56 MIL/uL (ref 4.22–5.81)
RDW: 13.4 % (ref 11.5–15.5)
WBC Count: 9.9 10*3/uL (ref 4.0–10.5)
nRBC: 0 % (ref 0.0–0.2)

## 2021-04-30 MED ORDER — PREDNISONE 10 MG (21) PO TBPK: ORAL_TABLET | ORAL | 0 refills | Status: AC

## 2021-04-30 MED ORDER — PROCHLORPERAZINE MALEATE 10 MG PO TABS: 10.0000 mg | ORAL_TABLET | Freq: Four times a day (QID) | ORAL | 0 refills | Status: AC | PRN

## 2021-04-30 MED ORDER — DEXAMETHASONE 4 MG PO TABS: ORAL_TABLET | ORAL | 0 refills | Status: AC

## 2021-04-30 MED ORDER — FOLIC ACID 1 MG PO TABS: 1.0000 mg | ORAL_TABLET | Freq: Every day | ORAL | 4 refills | Status: AC

## 2021-04-30 NOTE — Progress Notes (Signed)
[address]       [place], [address]             [phone]       HPI: Bill [name] returns for scheduled follow-up visit after a right lower lobectomy.  Bill [name] is a 60 year old man with a history of tobacco abuse who had a robotic assisted right lower lobectomy for a T3, N0, stage IIb adenocarcinoma.  There was invasion of the parietal pleura overlying the diaphragm but not the diaphragm per se.  His initial postoperative course was uncomplicated and he went home on day 4.  I saw him in the office a couple weeks ago.  He was not feeling well that day.  His chest x-ray showed some pleural effusion.  He now returns for follow-up.  In the interim since his last visit he is feeling much better.  He is not having any acute respiratory issues.  He does notice that his respiratory capacity is less than it was prior to surgery.  If anything is better than it was a couple weeks ago.  He says he only has pain when he sleeps on his right side.  He saw Dr. Julien Nordmann today and is scheduled to start chemotherapy after Thanksgiving.  He is anxious to return to work.  Past Medical History:  Diagnosis Date   Arthritis    Diabetes mellitus ([place])    History of kidney stones    Hyperlipidemia    Hypertension    Lung mass    Tobacco abuse      Current Outpatient Medications  Medication Sig Dispense Refill   albuterol (VENTOLIN HFA) 108 (90 Base) MCG/ACT inhaler Inhale 2 puffs into the lungs every 6 (six) hours as needed for wheezing or shortness of breath. 8 g 2   atorvastatin (LIPITOR) 40 MG tablet Take 40 mg by mouth daily.     dexamethasone (DECADRON) 4 MG tablet 1 tablet p.o. twice daily the day before, day of and day after the chemotherapy 30 tablet 0   DULoxetine (CYMBALTA) 30 MG capsule Take 30 mg by mouth daily.     folic acid (FOLVITE) 1 MG tablet Take 1 tablet (1 mg total) by mouth daily. 30 tablet 4   gabapentin (NEURONTIN) 400 MG capsule Take 400 mg by mouth 2  (two) times daily.     guaiFENesin (MUCINEX) 600 MG 12 hr tablet Take 1 tablet (600 mg total) by mouth 2 (two) times daily. Take if needed for cough or congestion.     lisinopril (ZESTRIL) 10 MG tablet Take 10 mg by mouth daily.     Melatonin 5 MG CAPS Take 5 mg by mouth at bedtime as needed (sleep).     meloxicam (MOBIC) 15 MG tablet Take 1 tablet by mouth daily.     metFORMIN (GLUCOPHAGE-XR) 500 MG 24 hr tablet Take 500 mg by mouth in the morning, at noon, and at bedtime.     methadone (DOLOPHINE) 10 MG tablet Take 20 mg by mouth in the morning, at noon, and at bedtime.     prochlorperazine (COMPAZINE) 10 MG tablet Take 1 tablet (10 mg total) by mouth every 6 (six) hours as needed for nausea or vomiting. 30 tablet 0   tadalafil (CIALIS) 5 MG tablet Take 1 tablet by mouth daily as needed for erectile dysfunction.     Testosterone Cypionate 200 MG/ML SOLN Inject 1 mL as directed every 14 (fourteen) days.     traMADol Veatrice Bourbon)  50 MG tablet Take 1-2 tablets by mouth every 6 (six) hours as needed.     No current facility-administered medications for this visit.    Physical Exam BP 107/64   Pulse 83   Resp 20   Ht 5\' 10"  (1.778 m)   Wt 184 lb (83.5 kg)   SpO2 96% Comment: RA  BMI 26.34 kg/m  60 year old man in no acute distress Alert and oriented x3 with no focal deficits Incisions well-healed Lungs absent breath sounds right base, otherwise clear No peripheral edema Cardiac regular rate and rhythm  Diagnostic Tests: I personally reviewed the chest x-ray images.  Unchanged from his previous study from a couple of weeks ago.  There is volume loss due to lobectomy.  There is some fluid posterior laterally.  Unchanged.  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  Small portion of the diaphragm was resected en  bloc with the tumor.  He looks dramatically better than he did just 2 weeks ago.  He is feeling well.  He really has minimal discomfort.  There is still some loculated fluid.  This may somewhat be due to not having enough lung to fill the entire chest space anymore with with the right lower lobectomy.  I think it is reasonable to try a prednisone taper to see if that helps.  I discussed that with Bill [name] and he agrees with that plan.  Plan: Prednisone taper Follow-up with Dr. Julien Nordmann for chemotherapy as planned I will plan to see him back sometime after the first of the year when he is up here seeing [facility] or having a treatment.  [name], MD [facility] [phone]

## 2021-04-30 NOTE — Progress Notes (Signed)
[place] [phone]   [phone]  CONSULT NOTE  REFERRING PHYSICIAN: Dr. Modesto Charon  REASON FOR CONSULTATION:  60 years old white male recently diagnosed with lung cancer  HPI Bill [name] is a 60 y.o. male with past medical history significant for osteoarthritis, diabetes mellitus, kidney stone, dyslipidemia as well as ankle surgery.  The patient also has a long history of smoking and quit a month ago.  He mentioned that he has a known right lower lobe lung nodule since 2018 that has been monitored on several scan.  On August 02, 2020 the patient had kidney stone and CT of the abdomen for evaluation of kidney stone was performed and that showed slight interval increase in the size of 1.5 x 1.2 x 1.8 cm subpleural nodular-like density with interval development of surrounding ground glass airspace opacity concerning for malignancy. On Nov 19, 2020 he had CT scan of the chest and that showed 3.6 x 3.0 cm mixed attenuation lesion with cavitation noted in the posterior right costophrenic sulcus progressive since 2018 and the images remain concerning for neoplasm.  There was also 0.5 cm perifissural nodule anterior right lung that is new since 2018.  There was also additional scattered tiny bilateral pulmonary nodules that are stable.  A PET scan was performed on February 05, 2021 and that showed hypermetabolic 3.4 cm right lower lobe pulmonary mass concerning for malignancy.  There was mildly hypermetabolic 0.8 cm subcutaneous nodule overlying the right scapula. The patient was referred to Dr. Roxan Hockey and on March 28, 2021 he underwent a robotic assisted right VATS with wedge resection with en bloc removal of diaphragm and a right lower lobectomy with primary repair of diaphragm and lymph node dissection. 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  The dissected lymph nodes were negative for malignancy.  The molecular studies by foundation one showed negative PD-L1 expression and no actionable mutations. The patient was referred to me today for evaluation and recommendation regarding treatment of his condition. When seen today the patient is feeling fine but has some weakness in his muscle since he has lack of exercise for the last several weeks.  He also has intermittent neck pain and shortness of breath with exertion but no significant chest pain, cough or hemoptysis.  He denied having any nausea, vomiting, diarrhea or constipation.  He denied having any headache or visual changes.  He has no fever or chills. Family history significant for mother with stroke and father had Alzheimer. The patient is married and has 2 children a son and daughter.  He was accompanied today by his wife Leana Roe.  He works as a Engineer, manufacturing systems.  He has a history of smoking less than 1 pack/day for around 40 years and quit October 2022.  He has no history of alcohol or drug abuse. HPI  Past Medical History:  Diagnosis Date   Arthritis    Diabetes mellitus ([place])    History of kidney stones    Hyperlipidemia    Hypertension    Lung mass    Tobacco abuse     Past Surgical History:  Procedure Laterality Date   ANKLE SURGERY     BACK SURGERY     INTERCOSTAL NERVE BLOCK Right 04/01/2021   Procedure: INTERCOSTAL NERVE BLOCK;  Surgeon: [name], MD;  Location: [facility] [place];  Service: Thoracic;  Laterality: Right;   KNEE CARTILAGE SURGERY     LYMPH NODE DISSECTION Right 04/01/2021   Procedure: LYMPH NODE DISSECTION;  Surgeon: [name], MD;  Location: [facility] OR;  Service: Thoracic;  Laterality: Right;    Family History  Problem Relation Age of Onset   CVA Mother    Pneumonia Father    Alzheimer's disease Father     Social History Social History    Tobacco Use   Smoking status: Former    Packs/day: 0.50    Types: Cigarettes   Smokeless tobacco: Never  Substance Use Topics   Alcohol use: Not Currently   Drug use: Never    Allergies  Allergen Reactions   Penicillins Other (See Comments)    Unknown but was told as child has reaction    Current Outpatient Medications  Medication Sig Dispense Refill   albuterol (VENTOLIN HFA) 108 (90 Base) MCG/ACT inhaler Inhale 2 puffs into the lungs every 6 (six) hours as needed for wheezing or shortness of breath. 8 g 2   atorvastatin (LIPITOR) 40 MG tablet Take 40 mg by mouth daily.     DULoxetine (CYMBALTA) 30 MG capsule Take 30 mg by mouth daily.     gabapentin (NEURONTIN) 400 MG capsule Take 400 mg by mouth 2 (two) times daily.     guaiFENesin (MUCINEX) 600 MG 12 hr tablet Take 1 tablet (600 mg total) by mouth 2 (two) times daily. Take if needed for cough or congestion.     lisinopril (ZESTRIL) 10 MG tablet Take 10 mg by mouth daily.     Melatonin 5 MG CAPS Take 5 mg by mouth at bedtime as needed (sleep).     meloxicam (MOBIC) 15 MG tablet Take 1 tablet by mouth daily.     metFORMIN (GLUCOPHAGE-XR) 500 MG 24 hr tablet Take 500 mg by mouth in the morning, at noon, and at bedtime.     methadone (DOLOPHINE) 10 MG tablet Take 20 mg by mouth in the morning, at noon, and at bedtime.     tadalafil (CIALIS) 5 MG tablet Take 1 tablet by mouth daily as needed for erectile dysfunction.     Testosterone Cypionate 200 MG/ML SOLN Inject 1 mL as directed every 14 (fourteen) days.     traMADol (ULTRAM) 50 MG tablet Take 1-2 tablets by mouth every 6 (six) hours as needed.     No current facility-administered medications for this visit.    Review of Systems  Constitutional: positive for fatigue Eyes: negative Ears, nose, mouth, throat, and face: negative Respiratory: positive for dyspnea on exertion Cardiovascular: negative Gastrointestinal: negative Genitourinary:negative Integument/breast:  negative Hematologic/lymphatic: negative Musculoskeletal:positive for neck pain Neurological: negative Behavioral/Psych: negative Endocrine: negative Allergic/Immunologic: negative  Physical Exam  ZSM:OLMBE, healthy, no distress, well nourished, well developed, and anxious SKIN: skin color, texture, turgor are normal, no rashes or significant lesions HEAD: Normocephalic, No masses, lesions, tenderness or abnormalities EYES: normal, PERRLA, Conjunctiva are pink and non-injected EARS: External ears normal, Canals clear OROPHARYNX:no exudate, no erythema, and lips, buccal mucosa, and tongue normal  NECK: supple, no adenopathy, no JVD LYMPH:  no palpable lymphadenopathy, no hepatosplenomegaly LUNGS: clear to auscultation , and palpation HEART: regular rate & rhythm, no murmurs, and no gallops ABDOMEN:abdomen soft, non-tender, normal bowel sounds, and no masses or organomegaly BACK: No CVA tenderness, Range of motion is normal EXTREMITIES:no joint deformities, effusion, or inflammation, no edema  NEURO: alert & oriented x 3 with fluent speech, no focal motor/sensory deficits  PERFORMANCE STATUS: ECOG 1  LABORATORY DATA: Lab Results  Component Value Date   WBC 9.9 04/30/2021   HGB 12.5 (L) 04/30/2021   HCT 38.1 (L) 04/30/2021   MCV 83.6 04/30/2021   PLT 261 04/30/2021      Chemistry      Component Value Date/Time   NA 135 04/04/2021 0111   K 4.0 04/04/2021 0111   CL 104 04/04/2021 0111   CO2 26 04/04/2021 0111   BUN 9 04/04/2021 0111   CREATININE 0.78 04/04/2021 0111      Component Value Date/Time   CALCIUM 8.3 (L) 04/04/2021 0111   ALKPHOS 68 04/03/2021 0130   AST 42 (H) 04/03/2021 0130   ALT 36 04/03/2021 0130   BILITOT 0.7 04/03/2021 0130       RADIOGRAPHIC STUDIES: DG Chest 2 View  Result Date: 04/15/2021 CLINICAL DATA:  Status post lobectomy 2 weeks ago. EXAM: CHEST - 2 VIEW COMPARISON:  Chest x-ray 04/05/2021. FINDINGS: There is a small/moderate right  pleural effusion. The left lung is clear. There is no evidence for pneumothorax. Sternotomy fixation plate present. The cardiomediastinal silhouette is within normal limits. No acute fractures are seen. IMPRESSION: 1. Small/moderate right pleural effusion. Cannot exclude underlying atelectasis/airspace disease in the right lower hemithorax. Electronically Signed   By: Ronney Asters M.D.   On: 04/15/2021 15:41   DG Chest 2 View  Result Date: 04/05/2021 CLINICAL DATA:  60 year old male status post lobectomy of the lung. EXAM: CHEST - 2 VIEW COMPARISON:  Chest x-ray 04/04/2021. FINDINGS: Elevation of the right hemidiaphragm. Status post right lower lobectomy. Small right sided pneumothorax, slightly decreased in size compared with yesterday's examination, occupying approximately 5% of the volume of the right hemithorax. No consolidative airspace disease. No pleural effusions. No left pneumothorax. No evidence of pulmonary edema. Heart size is mildly enlarged. Upper mediastinal contours are within normal limits. Small amount of residual subcutaneous emphysema in the right chest wall, similar to the prior study. Orthopedic fixation hardware in the region of the manubrium. IMPRESSION: 1. Postoperative changes from recent right lower lobectomy with slight decreased size of small right-sided pneumothorax. 2. Mild cardiomegaly. Electronically Signed   By: Vinnie Langton M.D.   On: 04/05/2021 08:24   DG Chest 1V REPEAT Same Day  Result Date: 04/04/2021 CLINICAL DATA:  Right-sided chest tube removal. EXAM: CHEST - 1 VIEW SAME DAY COMPARISON:  April 04, 2021 FINDINGS: Interval removal of right-sided chest tube. Stable appearance of small right apical pneumothorax. Cardiomediastinal silhouette is normal. Mediastinal contours appear intact. Osseous structures are without acute abnormality. Soft tissues are grossly normal. IMPRESSION: 1. Interval removal of right-sided chest tube. 2. Stable appearance of small right  apical pneumothorax. Electronically Signed   By: Fidela Salisbury M.D.   On: 04/04/2021 12:56   DG Chest Port 1 View  Result Date: 04/04/2021 CLINICAL DATA:  Dyspnea EXAM: PORTABLE CHEST 1 VIEW COMPARISON:  Chest radiograph from one day prior. FINDINGS: Stable surgical plate overlying the midline upper chest and stable upper right chest tube. Stable cardiomediastinal silhouette with normal heart size. Small right apical pneumothorax, less than 5%, slightly decreased. No left pneumothorax. No pleural effusion. Mild subcutaneous emphysema in the lateral lower right chest wall, slightly decreased. No pulmonary edema. Minimal bibasilar atelectasis, similar. IMPRESSION: 1. Small right apical pneumothorax, slightly decreased. Stable right chest tube. 2. Stable minimal bibasilar atelectasis. Electronically Signed   By: Ilona Sorrel M.D.   On: 04/04/2021 10:08   DG CHEST PORT 1 VIEW  Result  Date: 04/03/2021 CLINICAL DATA:  Pneumothorax. Additional history provided: Chest tube present, pneumothorax. EXAM: PORTABLE CHEST 1 VIEW COMPARISON:  Prior chest radiographs 04/02/2021 and earlier. FINDINGS: Unchanged position of an apically oriented right-sided chest tube. A small right apical pneumothorax is similar in volume as compared to the prior examination of 04/02/2021. Redemonstrated volume loss within the right hemithorax consistent with known prior lobectomy. No appreciable airspace consolidation. No evidence of pleural effusion. Heart size unchanged. External hardware. As before, there is subcutaneous emphysema along the right chest wall. IMPRESSION: Small right apical pneumothorax, not significantly changed from the prior chest radiograph of 04/02/2021. Unchanged position of a right-sided chest tube. Redemonstrated postsurgical changes to the right hemithorax. Electronically Signed   By: Kellie Simmering D.O.   On: 04/03/2021 08:53   DG Chest Port 1 View  Result Date: 04/02/2021 CLINICAL DATA:  Follow-up  pneumothorax.  Right lower lobectomy EXAM: PORTABLE CHEST 1 VIEW COMPARISON:  04/01/2021 FINDINGS: Right-sided chest tube remains in place. Persistent tiny right apical pneumothorax, not appreciably changed. Volume loss in the right hemithorax compatible with lobectomy. Lung fields are clear. No appreciable pleural fluid collection. No left-sided pneumothorax. Stable heart size. Prior sternal hardware. Subcutaneous emphysema again noted along the lower right chest wall. IMPRESSION: 1. Persistent tiny right apical pneumothorax, not appreciably changed. Right-sided chest tube remains in place. 2. Postsurgical changes in the right hemithorax. Electronically Signed   By: Davina Poke D.O.   On: 04/02/2021 09:15   DG Chest Port 1 View  Result Date: 04/01/2021 CLINICAL DATA:  Postop robotic assisted thorascopy with wedge resection and lower lobectomy. EXAM: PORTABLE CHEST 1 VIEW COMPARISON:  Radiographs 03/30/2021.  CT 11/17/2020. FINDINGS: 1558 hours. A right chest tube is in place. There is a minimal right apical pneumothorax with soft tissue emphysema laterally in the right chest wall. There is volume loss in the right hemithorax consistent with interval right lower lobectomy. The left lung is clear. The heart size and mediastinal contours are stable. There are postsurgical changes from previous median sternotomy. IMPRESSION: Interval right lower lobectomy with small right apical pneumothorax and right chest wall emphysema. Right chest tube in place. Electronically Signed   By: Richardean Sale M.D.   On: 04/01/2021 17:55    ASSESSMENT: This is a very pleasant 60 years old white male recently diagnosed with a stage IIb (T3, N0, MX) non-small cell lung cancer, adenocarcinoma with no actionable mutations and negative PD-L1 expression status post right lower lobectomy with lymph node dissection under the care of Dr. Roxan Hockey on April 01, 2021.   PLAN: I had a lengthy discussion with the patient and his  wife today about his current disease stage, prognosis and treatment options. I reviewed his previous imaging studies as well as the pathology report and discussed it with the patient. I will complete the staging work-up by ordering MRI of the brain to rule out brain metastasis. I also discussed with the patient his adjuvant treatment options and in the absence of any actionable mutations and PD-L1 expression, I recommended for the patient 4 cycles of systemic chemotherapy with cisplatin 75 Mg/M2 and Alimta 500 Mg/M2 every 3 weeks for a total of 4 cycles.  He is not a candidate for adjuvant targeted therapy or immunotherapy. I discussed with the patient the adverse effect of this treatment including but not limited to alopecia, myelosuppression, nausea and vomiting, peripheral neuropathy, liver or renal dysfunction. The patient is expected to start the first cycle of this treatment the week after  Thanksgiving. He will come before Thanksgiving for chemotherapy education class as well as vitamin B12 injection. I will send the prescription to his pharmacy with folic acid 1 mg p.o. daily in addition to Compazine 10 mg p.o. every 6 hours as needed for nausea and Decadron 4 mg p.o. twice daily the day before, day of and day after chemotherapy every 3 weeks. The patient will come back for follow-up visit with the first day of his treatment. He was advised to call immediately if he has any other concerning symptoms in the interval.  The patient voices understanding of current disease status and treatment options and is in agreement with the current care plan.  All questions were answered. The patient knows to call the clinic with any problems, questions or concerns. We can certainly see the patient much sooner if necessary.  Thank you so much for allowing me to participate in the care of Bill [name]. I will continue to follow up the patient with you and assist in his care.  The total time spent in the  appointment was 90 minutes.  Disclaimer: This note was dictated with voice recognition software. Similar sounding words can inadvertently be transcribed and may not be corrected upon review.   Eilleen Kempf April 30, 2021, 3:09 PM

## 2021-05-01 ENCOUNTER — Encounter: Payer: Self-pay | Admitting: Internal Medicine

## 2021-05-01 DIAGNOSIS — Z5111 Encounter for antineoplastic chemotherapy: Secondary | ICD-10-CM | POA: Insufficient documentation

## 2021-05-01 DIAGNOSIS — C3491 Malignant neoplasm of unspecified part of right bronchus or lung: Secondary | ICD-10-CM | POA: Insufficient documentation

## 2021-05-01 NOTE — Progress Notes (Signed)
START ON PATHWAY REGIMEN - Non-Small Cell Lung     A cycle is every 21 days:     Pemetrexed      Cisplatin   **Always confirm dose/schedule in your pharmacy ordering system**  Patient Characteristics: Postoperative without Neoadjuvant Therapy (Pathologic Staging), Stage IIB, Adjuvant Chemotherapy, Nonsquamous Cell Therapeutic Status: Postoperative without Neoadjuvant Therapy (Pathologic Staging) AJCC T Category: pT3 AJCC N Category: pN0 AJCC M Category: cM0 AJCC 8 Stage Grouping: IIB Histology: Nonsquamous Cell Intent of Therapy: Curative Intent, Discussed with Patient

## 2021-05-04 ENCOUNTER — Telehealth: Payer: Self-pay | Admitting: Internal Medicine

## 2021-05-04 NOTE — Telephone Encounter (Signed)
Scheduled follow-up appointments per 11/3 los. Patient's wife is aware.

## 2021-05-09 ENCOUNTER — Ambulatory Visit (HOSPITAL_COMMUNITY)
Admission: RE | Admit: 2021-05-09 | Discharge: 2021-05-09 | Disposition: A | Discharge status: Home / Self Care | Payer: BC Managed Care – PPO | Source: Ambulatory Visit | Attending: Internal Medicine | Admitting: Internal Medicine

## 2021-05-09 DIAGNOSIS — C3490 Malignant neoplasm of unspecified part of unspecified bronchus or lung: Secondary | ICD-10-CM | POA: Insufficient documentation

## 2021-05-09 MED ORDER — GADOBUTROL 1 MMOL/ML IV SOLN
8.5000 mL | Freq: Once | INTRAVENOUS | Status: AC | PRN
  Administered 2021-05-09: 8.5 mL via INTRAVENOUS

## 2021-05-14 ENCOUNTER — Encounter: Payer: Self-pay | Admitting: *Deleted

## 2021-05-14 NOTE — Progress Notes (Signed)
Oncology Nurse Navigator Documentation  Oncology Nurse Navigator Flowsheets 05/14/2021  Abnormal Finding Date 08/02/2020  Confirmed Diagnosis Date 04/01/2021  Diagnosis Status Confirmed Diagnosis Complete  Planned Course of Treatment Surgery;Neo Chemo  Phase of Treatment Chemo  Chemotherapy Pending- Reason: Surgeon or Oncologist Initiated  Chemotherapy Actual Start Date: 05/28/2021  Surgery Actual Start Date: 04/01/2021  Navigator Follow Up Date: 05/28/2021  Navigator Follow Up Reason: Follow-up Appointment  Navigator Location [facility]  Navigator Encounter Type Telephone/I followed up on Bill [name]'s schedule for his adjuvant tx. He is set up to start on 12/1.  I reviewed his medications with his wife and she wants patient to get suppository for nausea instead of oral pills. I notified Dr. Julien Nordmann of request.   Treatment Initiated Date 04/01/2021  Barriers/Navigation Needs Education;Coordination of Care  Education Other  Interventions Coordination of Care;Education;Psycho-Social Support  Acuity Level 2-Minimal Needs (1-2 Barriers Identified)  Coordination of Care Other  Education Method Verbal  Time Spent with Patient 30

## 2021-05-15 ENCOUNTER — Encounter: Payer: Self-pay | Admitting: *Deleted

## 2021-05-15 ENCOUNTER — Telehealth: Payer: Self-pay | Admitting: *Deleted

## 2021-05-15 NOTE — Progress Notes (Signed)
Oncology Nurse Navigator Documentation  Oncology Nurse Navigator Flowsheets 05/15/2021 05/14/2021  Abnormal Finding Date - 08/02/2020  Confirmed Diagnosis Date - 04/01/2021  Diagnosis Status - Confirmed Diagnosis Complete  Planned Course of Treatment - Surgery;Neo Chemo  Phase of Treatment - Chemo  Chemotherapy Pending- Reason: - Surgeon or Oncologist Initiated  Chemotherapy Actual Start Date: - 05/28/2021  Surgery Actual Start Date: - 04/01/2021  Navigator Follow Up Date: - 05/28/2021  Navigator Follow Up Reason: - Follow-up Appointment  Navigator Location [facility]  Navigator Encounter Type Telephone Telephone  Telephone Outgoing Call;Education -  Treatment Initiated Date - 04/01/2021  Barriers/Navigation Needs Education Education;Coordination of Care  Education Other Other  Interventions Education Coordination of Care;Education;Psycho-Social Support  Acuity Level 2-Minimal Needs (1-2 Barriers Identified) Level 2-Minimal Needs (1-2 Barriers Identified)  Coordination of Care - Other  Education Method Verbal Verbal  Time Spent with Patient 15 30

## 2021-05-19 ENCOUNTER — Ambulatory Visit: Payer: BC Managed Care – PPO

## 2021-05-19 ENCOUNTER — Other Ambulatory Visit: Payer: BC Managed Care – PPO

## 2021-05-20 ENCOUNTER — Inpatient Hospital Stay: Payer: BC Managed Care – PPO

## 2021-05-20 ENCOUNTER — Other Ambulatory Visit: Payer: Self-pay

## 2021-05-20 DIAGNOSIS — C3491 Malignant neoplasm of unspecified part of right bronchus or lung: Secondary | ICD-10-CM

## 2021-05-20 DIAGNOSIS — C3431 Malignant neoplasm of lower lobe, right bronchus or lung: Secondary | ICD-10-CM | POA: Diagnosis not present

## 2021-05-20 MED ORDER — CYANOCOBALAMIN 1000 MCG/ML IJ SOLN
1000.0000 ug | Freq: Once | INTRAMUSCULAR | Status: AC
  Administered 2021-05-20: 1000 ug via INTRAMUSCULAR
  Filled 2021-05-20: qty 1

## 2021-05-20 NOTE — Progress Notes (Signed)
Pharmacist Chemotherapy Monitoring - Initial Assessment    Anticipated start date: 05/28/21   The following has been reviewed per standard work regarding the patient's treatment regimen: The patient's diagnosis, treatment plan and drug doses, and organ/hematologic function Lab orders and baseline tests specific to treatment regimen  The treatment plan start date, drug sequencing, and pre-medications Prior authorization status  Patient's documented medication list, including drug-drug interaction screen and prescriptions for anti-emetics and supportive care specific to the treatment regimen The drug concentrations, fluid compatibility, administration routes, and timing of the medications to be used The patient's access for treatment and lifetime cumulative dose history, if applicable  The patient's medication allergies and previous infusion related reactions, if applicable   Changes made to treatment plan:  N/A  Follow up needed:  Pending authorization for treatment    Larene Beach, [place], 05/20/2021  12:01 PM

## 2021-05-20 NOTE — Patient Instructions (Signed)
[facility]  Discharge Instructions: Thank you for choosing [facility] to provide your oncology and hematology care.   If you have a lab appointment with the [facility], please go directly to the [facility] and check in at the registration area.   Wear comfortable clothing and clothing appropriate for easy access to any Portacath or PICC line.   We strive to give you quality time with your provider. You may need to reschedule your appointment if you arrive late (15 or more minutes).  Arriving late affects you and other patients whose appointments are after yours.  Also, if you miss three or more appointments without notifying the office, you may be dismissed from the clinic at the provider's discretion.      For prescription refill requests, have your pharmacy contact our office and allow 72 hours for refills to be completed.   B12 injection today    To help prevent nausea and vomiting after your treatment, we encourage you to take your nausea medication as directed.  BELOW ARE SYMPTOMS THAT SHOULD BE REPORTED IMMEDIATELY: *FEVER GREATER THAN 100.4 F (38 C) OR HIGHER *CHILLS OR SWEATING *NAUSEA AND VOMITING THAT IS NOT CONTROLLED WITH YOUR NAUSEA MEDICATION *UNUSUAL SHORTNESS OF BREATH *UNUSUAL BRUISING OR BLEEDING *URINARY PROBLEMS (pain or burning when urinating, or frequent urination) *BOWEL PROBLEMS (unusual diarrhea, constipation, pain near the anus) TENDERNESS IN MOUTH AND THROAT WITH OR WITHOUT PRESENCE OF ULCERS (sore throat, sores in mouth, or a toothache) UNUSUAL RASH, SWELLING OR PAIN  UNUSUAL VAGINAL DISCHARGE OR ITCHING   Items with * indicate a potential emergency and should be followed up as soon as possible or go to the Emergency Department if any problems should occur.  Please show the CHEMOTHERAPY ALERT CARD or IMMUNOTHERAPY ALERT CARD at check-in to the Emergency Department and triage nurse.  Should you have  questions after your visit or need to cancel or reschedule your appointment, please contact [facility]  Dept: [phone]  and follow the prompts.  Office hours are 8:00 a.m. to 4:30 p.m. Monday - Friday. Please note that voicemails left after 4:00 p.m. may not be returned until the following business day.  We are closed weekends and major holidays. You have access to a nurse at all times for urgent questions. Please call the main number to the clinic Dept: [phone] and follow the prompts.   For any non-urgent questions, you may also contact your provider using MyChart. We now offer e-Visits for anyone 41 and older to request care online for non-urgent symptoms. For details visit [email or web address].   Also download the MyChart app! Go to the app store, search "MyChart", open the app, select [facility], and log in with your MyChart username and password.  Due to Covid, a mask is required upon entering the hospital/clinic. If you do not have a mask, one will be given to you upon arrival. For doctor visits, patients may have 1 support person aged 52 or older with them. For treatment visits, patients cannot have anyone with them due to current Covid guidelines and our immunocompromised population.

## 2021-05-25 ENCOUNTER — Telehealth: Payer: Self-pay | Admitting: Medical Oncology

## 2021-05-25 NOTE — Telephone Encounter (Signed)
Pt reports cold symptoms. I told pt to keep his appt for dec 1.

## 2021-05-27 MED FILL — Fosaprepitant Dimeglumine For IV Infusion 150 MG (Base Eq): INTRAVENOUS | Qty: 5 | Status: AC

## 2021-05-27 MED FILL — Dexamethasone Sodium Phosphate Inj 100 MG/10ML: INTRAMUSCULAR | Qty: 1 | Status: AC

## 2021-05-28 ENCOUNTER — Other Ambulatory Visit: Payer: Self-pay

## 2021-05-28 ENCOUNTER — Encounter: Payer: Self-pay | Admitting: Internal Medicine

## 2021-05-28 ENCOUNTER — Inpatient Hospital Stay (HOSPITAL_BASED_OUTPATIENT_CLINIC_OR_DEPARTMENT_OTHER): Payer: BC Managed Care – PPO | Admitting: Physician Assistant

## 2021-05-28 ENCOUNTER — Inpatient Hospital Stay: Payer: BC Managed Care – PPO

## 2021-05-28 ENCOUNTER — Inpatient Hospital Stay: Payer: BC Managed Care – PPO | Attending: Internal Medicine

## 2021-05-28 ENCOUNTER — Inpatient Hospital Stay (HOSPITAL_BASED_OUTPATIENT_CLINIC_OR_DEPARTMENT_OTHER): Payer: BC Managed Care – PPO | Admitting: Internal Medicine

## 2021-05-28 VITALS — BP 141/69 | HR 86 | Temp 97.3°F | Resp 20 | Ht 70.0 in | Wt 184.8 lb

## 2021-05-28 DIAGNOSIS — C3491 Malignant neoplasm of unspecified part of right bronchus or lung: Secondary | ICD-10-CM

## 2021-05-28 DIAGNOSIS — Z902 Acquired absence of lung [part of]: Secondary | ICD-10-CM | POA: Diagnosis not present

## 2021-05-28 DIAGNOSIS — C3431 Malignant neoplasm of lower lobe, right bronchus or lung: Secondary | ICD-10-CM | POA: Diagnosis present

## 2021-05-28 DIAGNOSIS — T451X5A Adverse effect of antineoplastic and immunosuppressive drugs, initial encounter: Secondary | ICD-10-CM | POA: Diagnosis not present

## 2021-05-28 DIAGNOSIS — Z87891 Personal history of nicotine dependence: Secondary | ICD-10-CM | POA: Insufficient documentation

## 2021-05-28 DIAGNOSIS — Z5111 Encounter for antineoplastic chemotherapy: Secondary | ICD-10-CM

## 2021-05-28 DIAGNOSIS — T50905A Adverse effect of unspecified drugs, medicaments and biological substances, initial encounter: Secondary | ICD-10-CM | POA: Diagnosis not present

## 2021-05-28 LAB — CBC WITH DIFFERENTIAL (CANCER CENTER ONLY)
Abs Immature Granulocytes: 0.1 10*3/uL — ABNORMAL HIGH (ref 0.00–0.07)
Basophils Absolute: 0 10*3/uL (ref 0.0–0.1)
Basophils Relative: 0 %
Eosinophils Absolute: 0 10*3/uL (ref 0.0–0.5)
Eosinophils Relative: 0 %
HCT: 37.2 % — ABNORMAL LOW (ref 39.0–52.0)
Hemoglobin: 12.1 g/dL — ABNORMAL LOW (ref 13.0–17.0)
Immature Granulocytes: 1 %
Lymphocytes Relative: 15 %
Lymphs Abs: 1.6 10*3/uL (ref 0.7–4.0)
MCH: 27 pg (ref 26.0–34.0)
MCHC: 32.5 g/dL (ref 30.0–36.0)
MCV: 83 fL (ref 80.0–100.0)
Monocytes Absolute: 0.7 10*3/uL (ref 0.1–1.0)
Monocytes Relative: 7 %
Neutro Abs: 8.5 10*3/uL — ABNORMAL HIGH (ref 1.7–7.7)
Neutrophils Relative %: 77 %
Platelet Count: 267 10*3/uL (ref 150–400)
RBC: 4.48 MIL/uL (ref 4.22–5.81)
RDW: 13.7 % (ref 11.5–15.5)
WBC Count: 11 10*3/uL — ABNORMAL HIGH (ref 4.0–10.5)
nRBC: 0 % (ref 0.0–0.2)

## 2021-05-28 LAB — CMP (CANCER CENTER ONLY)
ALT: 20 U/L (ref 0–44)
AST: 12 U/L — ABNORMAL LOW (ref 15–41)
Albumin: 3.7 g/dL (ref 3.5–5.0)
Alkaline Phosphatase: 119 U/L (ref 38–126)
Anion gap: 10 (ref 5–15)
BUN: 17 mg/dL (ref 6–20)
CO2: 24 mmol/L (ref 22–32)
Calcium: 8.9 mg/dL (ref 8.9–10.3)
Chloride: 101 mmol/L (ref 98–111)
Creatinine: 0.94 mg/dL (ref 0.61–1.24)
GFR, Estimated: >60 mL/min (ref >60)
Glucose, Bld: 168 mg/dL — ABNORMAL HIGH (ref 70–99)
Potassium: 4.3 mmol/L (ref 3.5–5.1)
Sodium: 135 mmol/L (ref 135–145)
Total Bilirubin: <0.2 mg/dL — ABNORMAL LOW (ref 0.3–1.2)
Total Protein: 7.5 g/dL (ref 6.5–8.1)

## 2021-05-28 LAB — MAGNESIUM: Magnesium: 1.7 mg/dL (ref 1.7–2.4)

## 2021-05-28 MED ORDER — SODIUM CHLORIDE 0.9 % IV SOLN
500.0000 mg/m2 | Freq: Once | INTRAVENOUS | Status: AC
  Administered 2021-05-28: 1000 mg via INTRAVENOUS
  Filled 2021-05-28: qty 40

## 2021-05-28 MED ORDER — SODIUM CHLORIDE 0.9 % IV SOLN
10.0000 mg | Freq: Once | INTRAVENOUS | Status: AC
  Administered 2021-05-28: 10 mg via INTRAVENOUS
  Filled 2021-05-28: qty 10

## 2021-05-28 MED ORDER — SODIUM CHLORIDE 0.9 % IV SOLN
75.0000 mg/m2 | Freq: Once | INTRAVENOUS | Status: AC
  Administered 2021-05-28: 152 mg via INTRAVENOUS
  Filled 2021-05-28: qty 152

## 2021-05-28 MED ORDER — FOSAPREPITANT DIMEGLUMINE INJECTION 150 MG
150.0000 mg | Freq: Once | INTRAVENOUS | Status: AC
  Administered 2021-05-28: 150 mg via INTRAVENOUS
  Filled 2021-05-28: qty 150

## 2021-05-28 MED ORDER — PALONOSETRON HCL INJECTION 0.25 MG/5ML
0.2500 mg | Freq: Once | INTRAVENOUS | Status: AC
  Administered 2021-05-28: 0.25 mg via INTRAVENOUS
  Filled 2021-05-28: qty 5

## 2021-05-28 MED ORDER — FAMOTIDINE 20 MG IN NS 100 ML IVPB
20.0000 mg | Freq: Once | INTRAVENOUS | Status: AC | PRN
  Administered 2021-05-28: 20 mg via INTRAVENOUS

## 2021-05-28 MED ORDER — SODIUM CHLORIDE 0.9 % IV SOLN: Freq: Once | INTRAVENOUS | Status: AC

## 2021-05-28 MED ORDER — DIPHENHYDRAMINE HCL 50 MG/ML IJ SOLN
25.0000 mg | Freq: Once | INTRAMUSCULAR | Status: AC | PRN
  Administered 2021-05-28: 25 mg via INTRAVENOUS

## 2021-05-28 MED ORDER — POTASSIUM CHLORIDE IN NACL 20-0.9 MEQ/L-% IV SOLN
Freq: Once | INTRAVENOUS | Status: AC
  Filled 2021-05-28: qty 1000

## 2021-05-28 MED ORDER — MAGNESIUM SULFATE 2 GM/50ML IV SOLN
2.0000 g | Freq: Once | INTRAVENOUS | Status: AC
  Administered 2021-05-28: 2 g via INTRAVENOUS
  Filled 2021-05-28: qty 50

## 2021-05-28 MED ORDER — SODIUM CHLORIDE 0.9 % IV SOLN: Freq: Once | INTRAVENOUS | Status: AC | PRN

## 2021-05-28 NOTE — Patient Instructions (Signed)
[facility]  Discharge Instructions: Thank you for choosing [facility] to provide your oncology and hematology care.   If you have a lab appointment with the [facility], please go directly to the [facility] and check in at the registration area.   Wear comfortable clothing and clothing appropriate for easy access to any Portacath or PICC line.   We strive to give you quality time with your provider. You may need to reschedule your appointment if you arrive late (15 or more minutes).  Arriving late affects you and other patients whose appointments are after yours.  Also, if you miss three or more appointments without notifying the office, you may be dismissed from the clinic at the provider's discretion.      For prescription refill requests, have your pharmacy contact our office and allow 72 hours for refills to be completed.    Today you received the following chemotherapy and/or immunotherapy agents Alimta and Cisplatin      To help prevent nausea and vomiting after your treatment, we encourage you to take your nausea medication as directed.  BELOW ARE SYMPTOMS THAT SHOULD BE REPORTED IMMEDIATELY: *FEVER GREATER THAN 100.4 F (38 C) OR HIGHER *CHILLS OR SWEATING *NAUSEA AND VOMITING THAT IS NOT CONTROLLED WITH YOUR NAUSEA MEDICATION *UNUSUAL SHORTNESS OF BREATH *UNUSUAL BRUISING OR BLEEDING *URINARY PROBLEMS (pain or burning when urinating, or frequent urination) *BOWEL PROBLEMS (unusual diarrhea, constipation, pain near the anus) TENDERNESS IN MOUTH AND THROAT WITH OR WITHOUT PRESENCE OF ULCERS (sore throat, sores in mouth, or a toothache) UNUSUAL RASH, SWELLING OR PAIN  UNUSUAL VAGINAL DISCHARGE OR ITCHING   Items with * indicate a potential emergency and should be followed up as soon as possible or go to the Emergency Department if any problems should occur.  Please show the CHEMOTHERAPY ALERT CARD or IMMUNOTHERAPY ALERT CARD at  check-in to the Emergency Department and triage nurse.  Should you have questions after your visit or need to cancel or reschedule your appointment, please contact [facility]  Dept: [phone]  and follow the prompts.  Office hours are 8:00 a.m. to 4:30 p.m. Monday - Friday. Please note that voicemails left after 4:00 p.m. may not be returned until the following business day.  We are closed weekends and major holidays. You have access to a nurse at all times for urgent questions. Please call the main number to the clinic Dept: [phone] and follow the prompts.   For any non-urgent questions, you may also contact your provider using MyChart. We now offer e-Visits for anyone 100 and older to request care online for non-urgent symptoms. For details visit [email or web address].   Also download the MyChart app! Go to the app store, search "MyChart", open the app, select [facility], and log in with your MyChart username and password.  Due to Covid, a mask is required upon entering the hospital/clinic. If you do not have a mask, one will be given to you upon arrival. For doctor visits, patients may have 1 support person aged 77 or older with them. For treatment visits, patients cannot have anyone with them due to current Covid guidelines and our immunocompromised population.

## 2021-05-28 NOTE — Progress Notes (Signed)
DATE:  05/28/21                                        X CHEMO/IMMUNOTHERAPY REACTION             MD: Deanna Artis Heilingoetter PA-C   AGENT/BLOOD PRODUCT RECEIVING TODAY:              first time Pemetrexed and Cisplatin   AGENT/BLOOD PRODUCT RECEIVING IMMEDIATELY PRIOR TO REACTION:          Emend and decadron   VS: BP:     123/61   P:       88       SPO2:       97%                BP:     108/57   P:       87       SPO2:       97%     REACTION(S):           facial and forehead flushing   PREMEDS:     Palonosetron 0.25 mg IV, dexamethasone 10 mg IV    INTERVENTION: Emend stopped. IVF, pepcid 20 mg IV, and benadryl 25mg  IV given   Review of Systems  Review of Systems  Constitutional:  Negative for chills, diaphoresis and fever.  HENT:  Negative for facial swelling.   Eyes:  Negative for pain.  Respiratory:  Negative for cough, chest tightness, shortness of breath and wheezing.   Cardiovascular:  Negative for chest pain and palpitations.  Gastrointestinal:  Negative for abdominal pain, nausea and vomiting.  Endocrine: Negative for cold intolerance and heat intolerance.  Musculoskeletal:  Negative for arthralgias and myalgias.  Skin:  Positive for color change (facial flushing).  Neurological:  Negative for dizziness, speech difficulty and headaches.    Physical Exam  Physical Exam Vitals and nursing note reviewed.  Constitutional:      Appearance: He is well-developed. He is not ill-appearing or toxic-appearing.  HENT:     Head: Normocephalic and atraumatic.     Right Ear: External ear normal.     Left Ear: External ear normal.     Nose: Nose normal.     Mouth/Throat:     Mouth: Mucous membranes are moist.     Pharynx: Oropharynx is clear.  Eyes:     General: No scleral icterus.       Right eye: No discharge.        Left eye: No discharge.     Conjunctiva/sclera: Conjunctivae normal.  Neck:     Vascular: No JVD.  Cardiovascular:     Rate and Rhythm:  Normal rate and regular rhythm.     Pulses: Normal pulses.          Radial pulses are 2+ on the right side and 2+ on the left side.     Heart sounds: Normal heart sounds.  Pulmonary:     Effort: Pulmonary effort is normal. No respiratory distress.     Breath sounds: Normal breath sounds. No stridor. No wheezing, rhonchi or rales.  Abdominal:     General: There is no distension.  Musculoskeletal:        General: Normal range of motion.     Cervical back: Normal range of motion.     Right lower leg: No edema.  Left lower leg: No edema.  Skin:    General: Skin is warm and dry.     Findings: Erythema (facial flushing) present.  Neurological:     Mental Status: He is oriented to person, place, and time.     GCS: GCS eye subscore is 4. GCS verbal subscore is 5. GCS motor subscore is 6.     Comments: Fluent speech, no facial droop.  Psychiatric:        Behavior: Behavior normal.    OUTCOME:                 Reaction to Emend so medication discontinued and symptoms resolved after intervention. Oncology MD and PA informed. They plan to contact pharmacy to discuss alternatives in the future. Please see pharmacy note from today. Patient asymptomatic for remainder of therapy today.

## 2021-05-28 NOTE — Progress Notes (Signed)
    [facility] [phone]   [phone]  OFFICE PROGRESS NOTE  Hoffman, Byron, MD [address] [place] [address]  DIAGNOSIS: stage IIb (T3, N0, MX) non-small cell lung cancer, adenocarcinoma with no actionable mutations and negative PD-L1 expression.  PRIOR THERAPY:  status post right lower lobectomy with lymph node dissection under the care of Dr. Hendrickson on April 01, 2021.  CURRENT THERAPY: systemic chemotherapy with cisplatin 75 Mg/M2 and Alimta 500 Mg/M2 every 3 weeks.  First cycle May 28, 2021.  INTERVAL HISTORY: Bill [name] 60 y.o. male returns to the clinic today for follow-up visit.  The patient is feeling fine today with no concerning complaints except for the pain on the right side of the chest from the surgical scar.  He denied having any current shortness of breath, cough or hemoptysis.  He denied having any fever or chills.  He has no nausea, vomiting, diarrhea or constipation.  He has no headache or visual changes.  He is here today for evaluation before starting the first cycle of his adjuvant systemic chemotherapy.  MEDICAL HISTORY: Past Medical History:  Diagnosis Date   Arthritis    Diabetes mellitus (HCC)    History of kidney stones    Hyperlipidemia    Hypertension    Lung mass    Tobacco abuse     ALLERGIES:  is allergic to penicillins.  MEDICATIONS:  Current Outpatient Medications  Medication Sig Dispense Refill   albuterol (VENTOLIN HFA) 108 (90 Base) MCG/ACT inhaler Inhale 2 puffs into the lungs every 6 (six) hours as needed for wheezing or shortness of breath. 8 g 2   atorvastatin (LIPITOR) 40 MG tablet Take 40 mg by mouth daily.     dexamethasone (DECADRON) 4 MG tablet 1 tablet p.o. twice daily the day before, day of and day after the chemotherapy 30 tablet 0   DULoxetine (CYMBALTA) 30 MG capsule Take 30 mg by mouth daily.     folic acid (FOLVITE) 1 MG tablet Take 1 tablet (1  mg total) by mouth daily. 30 tablet 4   gabapentin (NEURONTIN) 400 MG capsule Take 400 mg by mouth 2 (two) times daily.     guaiFENesin (MUCINEX) 600 MG 12 hr tablet Take 1 tablet (600 mg total) by mouth 2 (two) times daily. Take if needed for cough or congestion.     lisinopril (ZESTRIL) 10 MG tablet Take 10 mg by mouth daily.     Melatonin 5 MG CAPS Take 5 mg by mouth at bedtime as needed (sleep).     meloxicam (MOBIC) 15 MG tablet Take 1 tablet by mouth daily.     metFORMIN (GLUCOPHAGE-XR) 500 MG 24 hr tablet Take 500 mg by mouth in the morning, at noon, and at bedtime.     methadone (DOLOPHINE) 10 MG tablet Take 20 mg by mouth in the morning, at noon, and at bedtime.     prochlorperazine (COMPAZINE) 10 MG tablet Take 1 tablet (10 mg total) by mouth every 6 (six) hours as needed for nausea or vomiting. 30 tablet 0   tadalafil (CIALIS) 5 MG tablet Take 1 tablet by mouth daily as needed for erectile dysfunction.     testosterone cypionate (DEPOTESTOSTERONE CYPIONATE) 200 MG/ML injection Inject 1 mL into the muscle every 14 (fourteen) days.     traMADol (ULTRAM) 50 MG tablet Take 1-2 tablets by mouth every 6 (six) hours as needed.     No current   facility-administered medications for this visit.    SURGICAL HISTORY:  Past Surgical History:  Procedure Laterality Date   ANKLE SURGERY     BACK SURGERY     INTERCOSTAL NERVE BLOCK Right 04/01/2021   Procedure: INTERCOSTAL NERVE BLOCK;  Surgeon: [name], MD;  Location: [facility];  Service: Thoracic;  Laterality: Right;   KNEE CARTILAGE SURGERY     LYMPH NODE DISSECTION Right 04/01/2021   Procedure: LYMPH NODE DISSECTION;  Surgeon: [name], MD;  Location: [facility];  Service: Thoracic;  Laterality: Right;    REVIEW OF SYSTEMS:  A comprehensive review of systems was negative except for: Respiratory: positive for pleurisy/chest pain   PHYSICAL EXAMINATION: General appearance: alert, cooperative, fatigued, and no distress Head:  Normocephalic, without obvious abnormality, atraumatic Neck: no adenopathy, no JVD, supple, symmetrical, trachea midline, and thyroid not enlarged, symmetric, no tenderness/mass/nodules Lymph nodes: Cervical, supraclavicular, and axillary nodes normal. Resp: clear to auscultation bilaterally Back: symmetric, no curvature. ROM normal. No CVA tenderness. Cardio: regular rate and rhythm, S1, S2 normal, no murmur, click, rub or gallop GI: soft, non-tender; bowel sounds normal; no masses,  no organomegaly Extremities: extremities normal, atraumatic, no cyanosis or edema  ECOG PERFORMANCE STATUS: 1 - Symptomatic but completely ambulatory  Blood pressure (!) 141/69, pulse 86, temperature (!) 97.3 F (36.3 C), temperature source Tympanic, resp. rate 20, height 5' 10" (1.778 m), weight 184 lb 12.8 oz (83.8 kg), SpO2 99 %.  LABORATORY DATA: Lab Results  Component Value Date   WBC 11.0 (H) 05/28/2021   HGB 12.1 (L) 05/28/2021   HCT 37.2 (L) 05/28/2021   MCV 83.0 05/28/2021   PLT 267 05/28/2021      Chemistry      Component Value Date/Time   NA 135 05/28/2021 0817   K 4.3 05/28/2021 0817   CL 101 05/28/2021 0817   CO2 24 05/28/2021 0817   BUN 17 05/28/2021 0817   CREATININE 0.94 05/28/2021 0817      Component Value Date/Time   CALCIUM 8.9 05/28/2021 0817   ALKPHOS 119 05/28/2021 0817   AST 12 (L) 05/28/2021 0817   ALT 20 05/28/2021 0817   BILITOT <0.2 (L) 05/28/2021 0817       RADIOGRAPHIC STUDIES: DG Chest 2 View  Result Date: 04/30/2021 CLINICAL DATA:  History of right lung surgery on April 01, 2021 EXAM: CHEST - 2 VIEW COMPARISON:  April 15, 2021 FINDINGS: Multiple sternal fixation plates and screws are seen. Persistent mild to moderate severity right basilar airspace disease is seen. A stable small to moderate size right pleural effusion is present. No pneumothorax is identified. The heart size and mediastinal contours are within normal limits. The visualized skeletal  structures are unremarkable. IMPRESSION: Persistent mild to moderate severity right basilar airspace disease with a small to moderate size right pleural effusion. Electronically Signed   By: Virgina Norfolk M.D.   On: 04/30/2021 16:33   MR Brain W Wo Contrast  Result Date: 05/10/2021 CLINICAL DATA:  60 year old male with non-small cell lung carcinoma. Status post lobectomy. Staging. EXAM: MRI HEAD WITHOUT AND WITH CONTRAST TECHNIQUE: Multiplanar, multiecho pulse sequences of the brain and surrounding structures were obtained without and with intravenous contrast. CONTRAST:  8.62m GADAVIST GADOBUTROL 1 MMOL/ML IV SOLN COMPARISON:  None. FINDINGS: Brain: No restricted diffusion to suggest acute infarction. No midline shift, mass effect, evidence of mass lesion, ventriculomegaly, extra-axial collection or acute intracranial hemorrhage. Cervicomedullary junction and pituitary are within normal limits. GPearline Cablesand white matter signal  is within normal limits for age throughout the brain. Probable dilated perivascular space tracking along the posterior right thalamus (normal variant, series 12, image 113). No cortical encephalomalacia or chronic cerebral blood products identified. No abnormal enhancement identified. No dural thickening. Vascular: Major intracranial vascular flow voids preserved. The major dural venous sinuses are enhancing and appear to be patent. Skull and upper cervical spine: Negative visible cervical spine. Visualized bone marrow signal is within normal limits. Sinuses/Orbits: Negative orbits. Mild maxillary sinus mucosal thickening greater on right. Other Visualized paranasal sinuses and mastoids are stable and well aerated. Other: Visible internal auditory structures appear normal. Negative visible scalp and face. IMPRESSION: No metastatic disease or acute intracranial abnormality. Normal for age MRI appearance of the brain. Electronically Signed   By: H  Hall M.D.   On: 05/10/2021 08:21     ASSESSMENT AND PLAN: This is a very pleasant 60 years old white male diagnosed with a stage IIb (T3, N0, M0) non-small cell lung cancer, adenocarcinoma with no actionable mutations and negative PD-L1 expression. The patient is here today for evaluation before starting the first cycle of his adjuvant systemic chemotherapy with cisplatin 75 Mg/M2 and Alimta 500 Mg/M2 every 3 weeks. He received chemotherapy education class last week. I reminded the patient of the adverse effect of this treatment. We will see him back for follow-up visit in 1 week for evaluation and management of any adverse effect of his treatment. The patient was advised to call immediately if he has any other concerning symptoms in the interval. The patient voices understanding of current disease status and treatment options and is in agreement with the current care plan.  All questions were answered. The patient knows to call the clinic with any problems, questions or concerns. We can certainly see the patient much sooner if necessary.  The total time spent in the appointment was 20 minutes.  Disclaimer: This note was dictated with voice recognition software. Similar sounding words can inadvertently be transcribed and may not be corrected upon review.        

## 2021-05-28 NOTE — Progress Notes (Signed)
1140  Flushing on face and neck noted.  Dexamethasone and Emend stopped.  Pt's legs jerked X2.  Pt denies SOB or CP.  No rash noted.    1142 flushing resolving.  Dexamethasone restarted.  1150 Dexamethasone completed.   Emend restarted.  1154 Flushing returned to face and neck.  Emend stopped  NS given  1200 Kaitlyn, PA at chair side.   Benadryl 25mg  IV given. 123/61, HR 88, 98%  Pt denies CP, SOB or other s/s of a reaction.  1204  Pt states he is feeling better.  131/61, 98% on room air. 92  1211 108/57, 87, 97%  1214  Pt continues to be flushed.  Pepcid 20 mg IV given.  1228  Flushing resolved.  120/69, 96% on room air, 81.  Per [place], [place] ok to restart treatment.  Discontinue Emend.  Pt will no longer get Emend.   1340  Pt denies any s/s of a reaction.   1657 Treatment completed.  Pt tolerated without further s/s of a reaction.  Pt left the unit ambulatory.

## 2021-05-28 NOTE — Progress Notes (Signed)
Pt w/ hypersensitivity w/ Emend IV today during infusion. D/W Cassie Heilingoetter, PA - will not add Zyprexa d/t interaction w/ Methadone; possible prolongation of QTc interval. Dr. Julien Nordmann aware & will monitor closely for CINV.  Kennith Center, Pharm.D., CPP 05/28/2021@12 :42 PM

## 2021-06-02 ENCOUNTER — Telehealth: Payer: Self-pay | Admitting: Internal Medicine

## 2021-06-02 NOTE — Telephone Encounter (Signed)
Pt requests not to come early in the morning or late at night. Cancelled 12/8 lab per 12/5 pt request. Pt asked to come later for lab and MD appt. Told pt we do not have MD availability at a later time that day. Will send message to RN.

## 2021-06-04 ENCOUNTER — Other Ambulatory Visit: Payer: BC Managed Care – PPO

## 2021-06-04 ENCOUNTER — Telehealth: Payer: Self-pay | Admitting: Internal Medicine

## 2021-06-04 ENCOUNTER — Ambulatory Visit: Payer: BC Managed Care – PPO | Admitting: Internal Medicine

## 2021-06-04 NOTE — Telephone Encounter (Signed)
Message has been left with pt about upcoming appt

## 2021-06-11 ENCOUNTER — Ambulatory Visit: Payer: BC Managed Care – PPO | Admitting: Internal Medicine

## 2021-06-11 ENCOUNTER — Other Ambulatory Visit: Payer: BC Managed Care – PPO

## 2021-06-18 ENCOUNTER — Encounter: Payer: Self-pay | Admitting: Internal Medicine

## 2021-06-18 ENCOUNTER — Encounter: Payer: Self-pay | Admitting: Medical Oncology

## 2021-06-18 ENCOUNTER — Inpatient Hospital Stay: Payer: BC Managed Care – PPO

## 2021-06-18 ENCOUNTER — Other Ambulatory Visit: Payer: Self-pay

## 2021-06-18 ENCOUNTER — Inpatient Hospital Stay (HOSPITAL_BASED_OUTPATIENT_CLINIC_OR_DEPARTMENT_OTHER): Payer: BC Managed Care – PPO | Admitting: Internal Medicine

## 2021-06-18 VITALS — BP 121/77 | HR 87 | Temp 97.6°F | Resp 18 | Ht 70.0 in | Wt 181.2 lb

## 2021-06-18 DIAGNOSIS — C3491 Malignant neoplasm of unspecified part of right bronchus or lung: Secondary | ICD-10-CM | POA: Diagnosis not present

## 2021-06-18 DIAGNOSIS — Z5111 Encounter for antineoplastic chemotherapy: Secondary | ICD-10-CM | POA: Diagnosis not present

## 2021-06-18 LAB — CBC WITH DIFFERENTIAL (CANCER CENTER ONLY)
Abs Immature Granulocytes: 0.11 10*3/uL — ABNORMAL HIGH (ref 0.00–0.07)
Basophils Absolute: 0 10*3/uL (ref 0.0–0.1)
Basophils Relative: 1 %
Eosinophils Absolute: 0.4 10*3/uL (ref 0.0–0.5)
Eosinophils Relative: 7 %
HCT: 37 % — ABNORMAL LOW (ref 39.0–52.0)
Hemoglobin: 12.1 g/dL — ABNORMAL LOW (ref 13.0–17.0)
Immature Granulocytes: 2 %
Lymphocytes Relative: 36 %
Lymphs Abs: 1.9 10*3/uL (ref 0.7–4.0)
MCH: 27.2 pg (ref 26.0–34.0)
MCHC: 32.7 g/dL (ref 30.0–36.0)
MCV: 83.1 fL (ref 80.0–100.0)
Monocytes Absolute: 0.8 10*3/uL (ref 0.1–1.0)
Monocytes Relative: 16 %
Neutro Abs: 2 10*3/uL (ref 1.7–7.7)
Neutrophils Relative %: 38 %
Platelet Count: 298 10*3/uL (ref 150–400)
RBC: 4.45 MIL/uL (ref 4.22–5.81)
RDW: 15.6 % — ABNORMAL HIGH (ref 11.5–15.5)
WBC Count: 5.3 10*3/uL (ref 4.0–10.5)
nRBC: 0 % (ref 0.0–0.2)

## 2021-06-18 LAB — CMP (CANCER CENTER ONLY)
ALT: 12 U/L (ref 0–44)
AST: 12 U/L — ABNORMAL LOW (ref 15–41)
Albumin: 4 g/dL (ref 3.5–5.0)
Alkaline Phosphatase: 107 U/L (ref 38–126)
Anion gap: 7 (ref 5–15)
BUN: 12 mg/dL (ref 6–20)
CO2: 31 mmol/L (ref 22–32)
Calcium: 9.5 mg/dL (ref 8.9–10.3)
Chloride: 99 mmol/L (ref 98–111)
Creatinine: 0.84 mg/dL (ref 0.61–1.24)
GFR, Estimated: >60 mL/min (ref >60)
Glucose, Bld: 162 mg/dL — ABNORMAL HIGH (ref 70–99)
Potassium: 4.4 mmol/L (ref 3.5–5.1)
Sodium: 137 mmol/L (ref 135–145)
Total Bilirubin: 0.3 mg/dL (ref 0.3–1.2)
Total Protein: 7.1 g/dL (ref 6.5–8.1)

## 2021-06-18 LAB — MAGNESIUM: Magnesium: 1.8 mg/dL (ref 1.7–2.4)

## 2021-06-18 MED ORDER — POTASSIUM CHLORIDE IN NACL 20-0.9 MEQ/L-% IV SOLN
Freq: Once | INTRAVENOUS | Status: AC
  Filled 2021-06-18: qty 1000

## 2021-06-18 MED ORDER — PALONOSETRON HCL INJECTION 0.25 MG/5ML
0.2500 mg | Freq: Once | INTRAVENOUS | Status: AC
  Administered 2021-06-18: 13:00:00 0.25 mg via INTRAVENOUS
  Filled 2021-06-18: qty 5

## 2021-06-18 MED ORDER — SODIUM CHLORIDE 0.9 % IV SOLN
10.0000 mg | Freq: Once | INTRAVENOUS | Status: AC
  Administered 2021-06-18: 13:00:00 10 mg via INTRAVENOUS
  Filled 2021-06-18: qty 10

## 2021-06-18 MED ORDER — SODIUM CHLORIDE 0.9 % IV SOLN
500.0000 mg/m2 | Freq: Once | INTRAVENOUS | Status: AC
  Administered 2021-06-18: 14:00:00 1000 mg via INTRAVENOUS
  Filled 2021-06-18: qty 40

## 2021-06-18 MED ORDER — SODIUM CHLORIDE 0.9 % IV SOLN: Freq: Once | INTRAVENOUS | Status: AC

## 2021-06-18 MED ORDER — SODIUM CHLORIDE 0.9 % IV SOLN
74.0000 mg/m2 | Freq: Once | INTRAVENOUS | Status: AC
  Administered 2021-06-18: 14:00:00 150 mg via INTRAVENOUS
  Filled 2021-06-18: qty 150

## 2021-06-18 MED ORDER — MAGNESIUM SULFATE 2 GM/50ML IV SOLN
2.0000 g | Freq: Once | INTRAVENOUS | Status: AC
  Administered 2021-06-18: 11:00:00 2 g via INTRAVENOUS
  Filled 2021-06-18: qty 50

## 2021-06-18 NOTE — Progress Notes (Signed)
[facility] [phone]   [phone]  OFFICE PROGRESS NOTE  [name], MD [address] [place] [address]  DIAGNOSIS: stage IIb (T3, N0, MX) non-small cell lung cancer, adenocarcinoma with no actionable mutations and negative PD-L1 expression.  PRIOR THERAPY:  status post right lower lobectomy with lymph node dissection under the care of Dr. Roxan Hockey on April 01, 2021.  CURRENT THERAPY: systemic chemotherapy with cisplatin 75 Mg/M2 and Alimta 500 Mg/M2 every 3 weeks.  First cycle May 28, 2021.  Status post 1 cycle.  INTERVAL HISTORY: Bill [name] 60 y.o. male returns to the clinic today for follow-up visit.  The patient is feeling fine today with no concerning complaints but he he has few days of nausea and fatigue after his chemotherapy.  It was mainly delayed nausea.  He tried his antinausea meds and was Compazine that was not effective.  He used some suppository and it was much better.  He denied having any current chest pain, shortness of breath, cough or hemoptysis.  He denied having any fever or chills.  He has no nausea, vomiting, diarrhea or constipation.  He is here today for evaluation before starting cycle #2.  He has some insurance issue from [employer] denying coverage for his molecular studies.  MEDICAL HISTORY: Past Medical History:  Diagnosis Date   Arthritis    Diabetes mellitus ([place])    History of kidney stones    Hyperlipidemia    Hypertension    Lung mass    Tobacco abuse     ALLERGIES:  is allergic to [employer] dimeglumine] and penicillins.  MEDICATIONS:  Current Outpatient Medications  Medication Sig Dispense Refill   albuterol (VENTOLIN HFA) 108 (90 Base) MCG/ACT inhaler Inhale 2 puffs into the lungs every 6 (six) hours as needed for wheezing or shortness of breath. 8 g 2   atorvastatin (LIPITOR) 40 MG tablet Take 40 mg by mouth daily.     dexamethasone  (DECADRON) 4 MG tablet 1 tablet p.o. twice daily the day before, day of and day after the chemotherapy 30 tablet 0   DULoxetine (CYMBALTA) 30 MG capsule Take 30 mg by mouth daily.     folic acid (FOLVITE) 1 MG tablet Take 1 tablet (1 mg total) by mouth daily. 30 tablet 4   gabapentin (NEURONTIN) 400 MG capsule Take 400 mg by mouth 2 (two) times daily.     guaiFENesin (MUCINEX) 600 MG 12 hr tablet Take 1 tablet (600 mg total) by mouth 2 (two) times daily. Take if needed for cough or congestion.     lisinopril (ZESTRIL) 10 MG tablet Take 10 mg by mouth daily.     Melatonin 5 MG CAPS Take 5 mg by mouth at bedtime as needed (sleep).     meloxicam (MOBIC) 15 MG tablet Take 1 tablet by mouth daily.     metFORMIN (GLUCOPHAGE-XR) 500 MG 24 hr tablet Take 500 mg by mouth in the morning, at noon, and at bedtime.     methadone (DOLOPHINE) 10 MG tablet Take 20 mg by mouth in the morning, at noon, and at bedtime.     prochlorperazine (COMPAZINE) 10 MG tablet Take 1 tablet (10 mg total) by mouth every 6 (six) hours as needed for nausea or vomiting. 30 tablet 0   tadalafil (CIALIS) 5 MG tablet Take 1 tablet by mouth daily as needed for erectile dysfunction.     testosterone cypionate (  DEPOTESTOSTERONE CYPIONATE) 200 MG/ML injection Inject 1 mL into the muscle every 14 (fourteen) days.     traMADol (ULTRAM) 50 MG tablet Take 1-2 tablets by mouth every 6 (six) hours as needed.     No current facility-administered medications for this visit.    SURGICAL HISTORY:  Past Surgical History:  Procedure Laterality Date   ANKLE SURGERY     BACK SURGERY     INTERCOSTAL NERVE BLOCK Right 04/01/2021   Procedure: INTERCOSTAL NERVE BLOCK;  Surgeon: [name], MD;  Location: [facility];  Service: Thoracic;  Laterality: Right;   KNEE CARTILAGE SURGERY     LYMPH NODE DISSECTION Right 04/01/2021   Procedure: LYMPH NODE DISSECTION;  Surgeon: [name], MD;  Location: [facility];  Service: Thoracic;  Laterality:  Right;    REVIEW OF SYSTEMS:  A comprehensive review of systems was negative except for: Constitutional: positive for fatigue Gastrointestinal: positive for nausea   PHYSICAL EXAMINATION: General appearance: alert, cooperative, fatigued, and no distress Head: Normocephalic, without obvious abnormality, atraumatic Neck: no adenopathy, no JVD, supple, symmetrical, trachea midline, and thyroid not enlarged, symmetric, no tenderness/mass/nodules Lymph nodes: Cervical, supraclavicular, and axillary nodes normal. Resp: clear to auscultation bilaterally Back: symmetric, no curvature. ROM normal. No CVA tenderness. Cardio: regular rate and rhythm, S1, S2 normal, no murmur, click, rub or gallop GI: soft, non-tender; bowel sounds normal; no masses,  no organomegaly Extremities: extremities normal, atraumatic, no cyanosis or edema  ECOG PERFORMANCE STATUS: 1 - Symptomatic but completely ambulatory  Blood pressure 121/77, pulse 87, temperature 97.6 F (36.4 C), temperature source Tympanic, resp. rate 18, height $RemoveBe'5\' 10"'SsMeOvbqC$  (1.778 m), weight 181 lb 3 oz (82.2 kg), SpO2 100 %.  LABORATORY DATA: Lab Results  Component Value Date   WBC 5.3 06/18/2021   HGB 12.1 (L) 06/18/2021   HCT 37.0 (L) 06/18/2021   MCV 83.1 06/18/2021   PLT 298 06/18/2021      Chemistry      Component Value Date/Time   NA 137 06/18/2021 0855   K 4.4 06/18/2021 0855   CL 99 06/18/2021 0855   CO2 31 06/18/2021 0855   BUN 12 06/18/2021 0855   CREATININE 0.84 06/18/2021 0855      Component Value Date/Time   CALCIUM 9.5 06/18/2021 0855   ALKPHOS 107 06/18/2021 0855   AST 12 (L) 06/18/2021 0855   ALT 12 06/18/2021 0855   BILITOT 0.3 06/18/2021 0855       RADIOGRAPHIC STUDIES: No results found.  ASSESSMENT AND PLAN: This is a very pleasant 60 years old white male diagnosed with a stage IIb (T3, N0, M0) non-small cell lung cancer, adenocarcinoma with no actionable mutations and negative PD-L1 expression. He started  adjuvant systemic chemotherapy with cisplatin 75 Mg/M2 and Alimta 500 Mg/M2 every 3 weeks on May 28, 2021 and he tolerated the first cycle of his treatment well except for the delayed nausea and fatigue. He is feeling much better today.  I recommended for him to proceed with cycle #2 today as planned. I will see him back for follow-up visit in 3 weeks for evaluation before starting cycle #3. I have provided the patient with contact information for [employer] to help him appealing his insurance denial for the molecular studies. He was advised to call immediately if he has any other concerning symptoms in the interval. The patient voices understanding of current disease status and treatment options and is in agreement with the current care plan.  All questions were answered.  The patient knows to call the clinic with any problems, questions or concerns. We can certainly see the patient much sooner if necessary.   Disclaimer: This note was dictated with voice recognition software. Similar sounding words can inadvertently be transcribed and may not be corrected upon review.

## 2021-06-18 NOTE — Progress Notes (Signed)
Letter for work excuse until end of March per [facility]. Letter given to pt.

## 2021-06-25 ENCOUNTER — Other Ambulatory Visit: Payer: BC Managed Care – PPO

## 2021-07-02 ENCOUNTER — Other Ambulatory Visit: Payer: BC Managed Care – PPO

## 2021-07-03 ENCOUNTER — Telehealth: Payer: Self-pay

## 2021-07-03 NOTE — Telephone Encounter (Signed)
Pt called wanting to know if he can have a Cortisone injection into his left shoulder.  Disucssed and reviewed with Cassandra, PA-C who advises it is okay for pt to receive this injection.  Pt has been advised of this and expressed understanding of this information.

## 2021-07-08 MED FILL — Dexamethasone Sodium Phosphate Inj 100 MG/10ML: INTRAMUSCULAR | Qty: 1 | Status: AC

## 2021-07-09 ENCOUNTER — Inpatient Hospital Stay: Payer: BC Managed Care – PPO

## 2021-07-09 ENCOUNTER — Inpatient Hospital Stay: Payer: BC Managed Care – PPO | Attending: Internal Medicine

## 2021-07-09 ENCOUNTER — Encounter: Payer: Self-pay | Admitting: Internal Medicine

## 2021-07-09 ENCOUNTER — Inpatient Hospital Stay: Payer: BC Managed Care – PPO | Admitting: Internal Medicine

## 2021-07-09 ENCOUNTER — Other Ambulatory Visit: Payer: Self-pay

## 2021-07-09 VITALS — BP 139/82 | HR 102 | Temp 97.6°F | Resp 18 | Ht 70.0 in | Wt 181.3 lb

## 2021-07-09 VITALS — BP 139/75 | HR 87

## 2021-07-09 DIAGNOSIS — C3431 Malignant neoplasm of lower lobe, right bronchus or lung: Secondary | ICD-10-CM | POA: Diagnosis present

## 2021-07-09 DIAGNOSIS — C3491 Malignant neoplasm of unspecified part of right bronchus or lung: Secondary | ICD-10-CM

## 2021-07-09 DIAGNOSIS — Z5111 Encounter for antineoplastic chemotherapy: Secondary | ICD-10-CM | POA: Insufficient documentation

## 2021-07-09 LAB — CMP (CANCER CENTER ONLY)
ALT: 11 U/L (ref 0–44)
AST: 12 U/L — ABNORMAL LOW (ref 15–41)
Albumin: 4 g/dL (ref 3.5–5.0)
Alkaline Phosphatase: 105 U/L (ref 38–126)
Anion gap: 9 (ref 5–15)
BUN: 13 mg/dL (ref 6–20)
CO2: 26 mmol/L (ref 22–32)
Calcium: 9.3 mg/dL (ref 8.9–10.3)
Chloride: 102 mmol/L (ref 98–111)
Creatinine: 0.87 mg/dL (ref 0.61–1.24)
GFR, Estimated: >60 mL/min (ref >60)
Glucose, Bld: 182 mg/dL — ABNORMAL HIGH (ref 70–99)
Potassium: 4.7 mmol/L (ref 3.5–5.1)
Sodium: 137 mmol/L (ref 135–145)
Total Bilirubin: 0.3 mg/dL (ref 0.3–1.2)
Total Protein: 7.2 g/dL (ref 6.5–8.1)

## 2021-07-09 LAB — CBC WITH DIFFERENTIAL (CANCER CENTER ONLY)
Abs Immature Granulocytes: 0.02 10*3/uL (ref 0.00–0.07)
Basophils Absolute: 0 10*3/uL (ref 0.0–0.1)
Basophils Relative: 0 %
Eosinophils Absolute: 0 10*3/uL (ref 0.0–0.5)
Eosinophils Relative: 0 %
HCT: 32.9 % — ABNORMAL LOW (ref 39.0–52.0)
Hemoglobin: 10.9 g/dL — ABNORMAL LOW (ref 13.0–17.0)
Immature Granulocytes: 0 %
Lymphocytes Relative: 17 %
Lymphs Abs: 0.8 10*3/uL (ref 0.7–4.0)
MCH: 27 pg (ref 26.0–34.0)
MCHC: 33.1 g/dL (ref 30.0–36.0)
MCV: 81.6 fL (ref 80.0–100.0)
Monocytes Absolute: 0.2 10*3/uL (ref 0.1–1.0)
Monocytes Relative: 5 %
Neutro Abs: 3.7 10*3/uL (ref 1.7–7.7)
Neutrophils Relative %: 78 %
Platelet Count: 240 10*3/uL (ref 150–400)
RBC: 4.03 MIL/uL — ABNORMAL LOW (ref 4.22–5.81)
RDW: 16.7 % — ABNORMAL HIGH (ref 11.5–15.5)
WBC Count: 4.8 10*3/uL (ref 4.0–10.5)
nRBC: 0 % (ref 0.0–0.2)

## 2021-07-09 LAB — MAGNESIUM: Magnesium: 1.6 mg/dL — ABNORMAL LOW (ref 1.7–2.4)

## 2021-07-09 MED ORDER — SODIUM CHLORIDE 0.9 % IV SOLN: Freq: Once | INTRAVENOUS | Status: AC

## 2021-07-09 MED ORDER — SODIUM CHLORIDE 0.9 % IV SOLN
10.0000 mg | Freq: Once | INTRAVENOUS | Status: AC
  Administered 2021-07-09: 10 mg via INTRAVENOUS
  Filled 2021-07-09: qty 10

## 2021-07-09 MED ORDER — POTASSIUM CHLORIDE IN NACL 20-0.9 MEQ/L-% IV SOLN
Freq: Once | INTRAVENOUS | Status: AC
  Filled 2021-07-09: qty 1000

## 2021-07-09 MED ORDER — PALONOSETRON HCL INJECTION 0.25 MG/5ML
0.2500 mg | Freq: Once | INTRAVENOUS | Status: AC
  Administered 2021-07-09: 0.25 mg via INTRAVENOUS
  Filled 2021-07-09: qty 5

## 2021-07-09 MED ORDER — CYANOCOBALAMIN 1000 MCG/ML IJ SOLN
1000.0000 ug | Freq: Once | INTRAMUSCULAR | Status: AC
  Administered 2021-07-09: 1000 ug via INTRAMUSCULAR
  Filled 2021-07-09: qty 1

## 2021-07-09 MED ORDER — MAGNESIUM SULFATE 2 GM/50ML IV SOLN
2.0000 g | Freq: Once | INTRAVENOUS | Status: AC
  Administered 2021-07-09: 2 g via INTRAVENOUS
  Filled 2021-07-09: qty 50

## 2021-07-09 MED ORDER — SODIUM CHLORIDE 0.9 % IV SOLN
74.0000 mg/m2 | Freq: Once | INTRAVENOUS | Status: AC
  Administered 2021-07-09: 150 mg via INTRAVENOUS
  Filled 2021-07-09: qty 150

## 2021-07-09 MED ORDER — SODIUM CHLORIDE 0.9 % IV SOLN
500.0000 mg/m2 | Freq: Once | INTRAVENOUS | Status: AC
  Administered 2021-07-09: 1000 mg via INTRAVENOUS
  Filled 2021-07-09: qty 40

## 2021-07-09 NOTE — Patient Instructions (Signed)
[facility]  Discharge Instructions: Thank you for choosing [facility] to provide your oncology and hematology care.   If you have a lab appointment with the [facility], please go directly to the Center and check in at the registration area.   Wear comfortable clothing and clothing appropriate for easy access to any Portacath or PICC line.   We strive to give you quality time with your provider. You may need to reschedule your appointment if you arrive late (15 or more minutes).  Arriving late affects you and other patients whose appointments are after yours.  Also, if you miss three or more appointments without notifying the office, you may be dismissed from the clinic at the providers discretion.      For prescription refill requests, have your pharmacy contact our office and allow 72 hours for refills to be completed.    Today you received the following chemotherapy and/or immunotherapy agents: Pemetrexed (Alimta) and Cisplatin.    To help prevent nausea and vomiting after your treatment, we encourage you to take your nausea medication as directed.  BELOW ARE SYMPTOMS THAT SHOULD BE REPORTED IMMEDIATELY: *FEVER GREATER THAN 100.4 F (38 C) OR HIGHER *CHILLS OR SWEATING *NAUSEA AND VOMITING THAT IS NOT CONTROLLED WITH YOUR NAUSEA MEDICATION *UNUSUAL SHORTNESS OF BREATH *UNUSUAL BRUISING OR BLEEDING *URINARY PROBLEMS (pain or burning when urinating, or frequent urination) *BOWEL PROBLEMS (unusual diarrhea, constipation, pain near the anus) TENDERNESS IN MOUTH AND THROAT WITH OR WITHOUT PRESENCE OF ULCERS (sore throat, sores in mouth, or a toothache) UNUSUAL RASH, SWELLING OR PAIN  UNUSUAL VAGINAL DISCHARGE OR ITCHING   Items with * indicate a potential emergency and should be followed up as soon as possible or go to the Emergency Department if any problems should occur.  Please show the CHEMOTHERAPY ALERT CARD or IMMUNOTHERAPY  ALERT CARD at check-in to the Emergency Department and triage nurse.  Should you have questions after your visit or need to cancel or reschedule your appointment, please contact [facility]  Dept: [phone]  and follow the prompts.  Office hours are 8:00 a.m. to 4:30 p.m. Monday - Friday. Please note that voicemails left after 4:00 p.m. may not be returned until the following business day.  We are closed weekends and major holidays. You have access to a nurse at all times for urgent questions. Please call the main number to the clinic Dept: [phone] and follow the prompts.   For any non-urgent questions, you may also contact your provider using MyChart. We now offer e-Visits for anyone 90 and older to request care online for non-urgent symptoms. For details visit [email or web address].   Also download the MyChart app! Go to the app store, search "MyChart", open the app, select [facility], and log in with your MyChart username and password.  Due to Covid, a mask is required upon entering the hospital/clinic. If you do not have a mask, one will be given to you upon arrival. For doctor visits, patients may have 1 support person aged 63 or older with them. For treatment visits, patients cannot have anyone with them due to current Covid guidelines and our immunocompromised population.

## 2021-07-09 NOTE — Progress Notes (Signed)
[facility] Telephone:([phone]   Fax:(336) [phone]  OFFICE PROGRESS NOTE  Bill Bott, MD [address] [place] [address]  DIAGNOSIS: stage IIb (T3, N0, MX) non-small cell lung cancer, adenocarcinoma with no actionable mutations and negative PD-L1 expression.  PRIOR THERAPY:  status post right lower lobectomy with lymph node dissection under the care of Dr. Roxan Hockey on April 01, 2021.  CURRENT THERAPY: systemic chemotherapy with cisplatin 75 Mg/M2 and Alimta 500 Mg/M2 every 3 weeks.  First cycle May 28, 2021.  Status post 2 cycles.  INTERVAL HISTORY: Bill [name] 61 y.o. male returns to the clinic today for follow-up visit.  The patient is feeling much better and tolerated the second cycle of his treatment better than the first cycle.  He he knew what to expect with the treatment and he was taking his nausea medicine as prescribed.  He has no current chest pain except for some numbness on the right side of the chest but no shortness of breath, cough or hemoptysis.  He denied having any fever or chills.  He has no headache or visual changes.  He is here today for evaluation before starting cycle #3 of his treatment.  MEDICAL HISTORY: Past Medical History:  Diagnosis Date   Arthritis    Diabetes mellitus ([facility] [facility])    History of kidney stones    Hyperlipidemia    Hypertension    Lung mass    Tobacco abuse     ALLERGIES:  is allergic to [employer] dimeglumine] and penicillins.  MEDICATIONS:  Current Outpatient Medications  Medication Sig Dispense Refill   albuterol (VENTOLIN HFA) 108 (90 Base) MCG/ACT inhaler Inhale 2 puffs into the lungs every 6 (six) hours as needed for wheezing or shortness of breath. 8 g 2   atorvastatin (LIPITOR) 40 MG tablet Take 40 mg by mouth daily.     dexamethasone (DECADRON) 4 MG tablet 1 tablet p.o. twice daily the day before, day of and day after the chemotherapy 30 tablet 0    DULoxetine (CYMBALTA) 30 MG capsule Take 30 mg by mouth daily.     folic acid (FOLVITE) 1 MG tablet Take 1 tablet (1 mg total) by mouth daily. 30 tablet 4   gabapentin (NEURONTIN) 400 MG capsule Take 400 mg by mouth 2 (two) times daily.     guaiFENesin (MUCINEX) 600 MG 12 hr tablet Take 1 tablet (600 mg total) by mouth 2 (two) times daily. Take if needed for cough or congestion.     lisinopril (ZESTRIL) 10 MG tablet Take 10 mg by mouth daily.     Melatonin 5 MG CAPS Take 5 mg by mouth at bedtime as needed (sleep).     meloxicam (MOBIC) 15 MG tablet Take 1 tablet by mouth daily.     metFORMIN (GLUCOPHAGE-XR) 500 MG 24 hr tablet Take 500 mg by mouth in the morning, at noon, and at bedtime.     methadone (DOLOPHINE) 10 MG tablet Take 20 mg by mouth in the morning, at noon, and at bedtime.     prochlorperazine (COMPAZINE) 10 MG tablet Take 1 tablet (10 mg total) by mouth every 6 (six) hours as needed for nausea or vomiting. 30 tablet 0   tadalafil (CIALIS) 5 MG tablet Take 1 tablet by mouth daily as needed for erectile dysfunction.     testosterone cypionate (DEPOTESTOSTERONE CYPIONATE) 200 MG/ML injection Inject 1 mL into the muscle every 14 (fourteen) days.  traMADol (ULTRAM) 50 MG tablet Take 1-2 tablets by mouth every 6 (six) hours as needed.     No current facility-administered medications for this visit.    SURGICAL HISTORY:  Past Surgical History:  Procedure Laterality Date   ANKLE SURGERY     BACK SURGERY     INTERCOSTAL NERVE BLOCK Right 04/01/2021   Procedure: INTERCOSTAL NERVE BLOCK;  Surgeon: [name], MD;  Location: [facility];  Service: Thoracic;  Laterality: Right;   KNEE CARTILAGE SURGERY     LYMPH NODE DISSECTION Right 04/01/2021   Procedure: LYMPH NODE DISSECTION;  Surgeon: [name], MD;  Location: [facility];  Service: Thoracic;  Laterality: Right;    REVIEW OF SYSTEMS:  A comprehensive review of systems was negative except for: Constitutional: positive  for fatigue   PHYSICAL EXAMINATION: General appearance: alert, cooperative, fatigued, and no distress Head: Normocephalic, without obvious abnormality, atraumatic Neck: no adenopathy, no JVD, supple, symmetrical, trachea midline, and thyroid not enlarged, symmetric, no tenderness/mass/nodules Lymph nodes: Cervical, supraclavicular, and axillary nodes normal. Resp: clear to auscultation bilaterally Back: symmetric, no curvature. ROM normal. No CVA tenderness. Cardio: regular rate and rhythm, S1, S2 normal, no murmur, click, rub or gallop GI: soft, non-tender; bowel sounds normal; no masses,  no organomegaly Extremities: extremities normal, atraumatic, no cyanosis or edema  ECOG PERFORMANCE STATUS: 1 - Symptomatic but completely ambulatory  Blood pressure 139/82, pulse (!) 102, temperature 97.6 F (36.4 C), temperature source Tympanic, resp. rate 18, height _0  (1.778 m), weight 181 lb 4.8 oz (82.2 kg), SpO2 99 %.  LABORATORY DATA: Lab Results  Component Value Date   WBC 4.8 07/09/2021   HGB 10.9 (L) 07/09/2021   HCT 32.9 (L) 07/09/2021   MCV 81.6 07/09/2021   PLT 240 07/09/2021      Chemistry      Component Value Date/Time   NA 137 06/18/2021 0855   K 4.4 06/18/2021 0855   CL 99 06/18/2021 0855   CO2 31 06/18/2021 0855   BUN 12 06/18/2021 0855   CREATININE 0.84 06/18/2021 0855      Component Value Date/Time   CALCIUM 9.5 06/18/2021 0855   ALKPHOS 107 06/18/2021 0855   AST 12 (L) 06/18/2021 0855   ALT 12 06/18/2021 0855   BILITOT 0.3 06/18/2021 0855       RADIOGRAPHIC STUDIES: No results found.  ASSESSMENT AND PLAN: This is a very pleasant 61 years old white male diagnosed with a stage IIb (T3, N0, M0) non-small cell lung cancer, adenocarcinoma with no actionable mutations and negative PD-L1 expression. He started adjuvant systemic chemotherapy with cisplatin 75 Mg/M2 and Alimta 500 Mg/M2 every 3 weeks on May 28, 2021.  Status post 2 cycles. The patient  tolerated the second cycle of his treatment much better than the first one. I recommended for him to proceed with cycle #3 today as planned. I will see the patient back for follow-up visit in 3 weeks for evaluation before starting cycle #4 which is expected to be the last cycle of his treatment. He was advised to call immediately if he has any other concerning symptoms in the interval. The patient voices understanding of current disease status and treatment options and is in agreement with the current care plan.  All questions were answered. The patient knows to call the clinic with any problems, questions or concerns. We can certainly see the patient much sooner if necessary.   Disclaimer: This note was dictated with voice recognition software. Similar sounding  words can inadvertently be transcribed and may not be corrected upon review.

## 2021-07-10 ENCOUNTER — Telehealth: Payer: Self-pay

## 2021-07-10 NOTE — Telephone Encounter (Signed)
Patient's wife called about the 2 additional treatments on the patient's schedule stating that Dr. Earlie Server said the patient only needed 4 treatments. His 4th treatment would be on 07/30/21. I spoke with Cassandra, PA regarding this matter, and she said it was OK to remove the 2 additional appointments, and that they would talk to the patient on their next visit regarding future scans/treatments. I let the wife know that the appointments were removed, and she had no further questions or concerns.

## 2021-07-16 ENCOUNTER — Other Ambulatory Visit: Payer: BC Managed Care – PPO

## 2021-07-23 ENCOUNTER — Inpatient Hospital Stay: Payer: BC Managed Care – PPO

## 2021-07-29 MED FILL — Dexamethasone Sodium Phosphate Inj 100 MG/10ML: INTRAMUSCULAR | Qty: 1 | Status: AC

## 2021-07-30 ENCOUNTER — Other Ambulatory Visit: Payer: Self-pay

## 2021-07-30 ENCOUNTER — Inpatient Hospital Stay: Payer: BC Managed Care – PPO | Admitting: Internal Medicine

## 2021-07-30 ENCOUNTER — Inpatient Hospital Stay: Payer: BC Managed Care – PPO | Attending: Internal Medicine

## 2021-07-30 ENCOUNTER — Encounter: Payer: Self-pay | Admitting: Internal Medicine

## 2021-07-30 ENCOUNTER — Inpatient Hospital Stay: Payer: BC Managed Care – PPO

## 2021-07-30 VITALS — BP 139/82 | HR 65 | Temp 97.8°F | Resp 19 | Ht 70.0 in | Wt 174.1 lb

## 2021-07-30 DIAGNOSIS — C3491 Malignant neoplasm of unspecified part of right bronchus or lung: Secondary | ICD-10-CM

## 2021-07-30 DIAGNOSIS — Z5111 Encounter for antineoplastic chemotherapy: Secondary | ICD-10-CM | POA: Insufficient documentation

## 2021-07-30 DIAGNOSIS — C3490 Malignant neoplasm of unspecified part of unspecified bronchus or lung: Secondary | ICD-10-CM

## 2021-07-30 DIAGNOSIS — C3431 Malignant neoplasm of lower lobe, right bronchus or lung: Secondary | ICD-10-CM | POA: Insufficient documentation

## 2021-07-30 LAB — CBC WITH DIFFERENTIAL (CANCER CENTER ONLY)
Abs Immature Granulocytes: 0.01 10*3/uL (ref 0.00–0.07)
Basophils Absolute: 0 10*3/uL (ref 0.0–0.1)
Basophils Relative: 1 %
Eosinophils Absolute: 0.2 10*3/uL (ref 0.0–0.5)
Eosinophils Relative: 5 %
HCT: 30.6 % — ABNORMAL LOW (ref 39.0–52.0)
Hemoglobin: 10.1 g/dL — ABNORMAL LOW (ref 13.0–17.0)
Immature Granulocytes: 0 %
Lymphocytes Relative: 40 %
Lymphs Abs: 1.7 10*3/uL (ref 0.7–4.0)
MCH: 27.4 pg (ref 26.0–34.0)
MCHC: 33 g/dL (ref 30.0–36.0)
MCV: 82.9 fL (ref 80.0–100.0)
Monocytes Absolute: 0.6 10*3/uL (ref 0.1–1.0)
Monocytes Relative: 15 %
Neutro Abs: 1.7 10*3/uL (ref 1.7–7.7)
Neutrophils Relative %: 39 %
Platelet Count: 263 10*3/uL (ref 150–400)
RBC: 3.69 MIL/uL — ABNORMAL LOW (ref 4.22–5.81)
RDW: 18.6 % — ABNORMAL HIGH (ref 11.5–15.5)
WBC Count: 4.2 10*3/uL (ref 4.0–10.5)
nRBC: 0 % (ref 0.0–0.2)

## 2021-07-30 LAB — CMP (CANCER CENTER ONLY)
ALT: 10 U/L (ref 0–44)
AST: 15 U/L (ref 15–41)
Albumin: 4.1 g/dL (ref 3.5–5.0)
Alkaline Phosphatase: 105 U/L (ref 38–126)
Anion gap: 7 (ref 5–15)
BUN: 21 mg/dL — ABNORMAL HIGH (ref 6–20)
CO2: 29 mmol/L (ref 22–32)
Calcium: 9.4 mg/dL (ref 8.9–10.3)
Chloride: 102 mmol/L (ref 98–111)
Creatinine: 0.93 mg/dL (ref 0.61–1.24)
GFR, Estimated: >60 mL/min (ref >60)
Glucose, Bld: 91 mg/dL (ref 70–99)
Potassium: 4.1 mmol/L (ref 3.5–5.1)
Sodium: 138 mmol/L (ref 135–145)
Total Bilirubin: 0.3 mg/dL (ref 0.3–1.2)
Total Protein: 7.2 g/dL (ref 6.5–8.1)

## 2021-07-30 LAB — MAGNESIUM: Magnesium: 1.9 mg/dL (ref 1.7–2.4)

## 2021-07-30 MED ORDER — MAGNESIUM SULFATE 2 GM/50ML IV SOLN
2.0000 g | Freq: Once | INTRAVENOUS | Status: AC
  Administered 2021-07-30: 2 g via INTRAVENOUS
  Filled 2021-07-30: qty 50

## 2021-07-30 MED ORDER — SODIUM CHLORIDE 0.9 % IV SOLN: Freq: Once | INTRAVENOUS | Status: AC

## 2021-07-30 MED ORDER — PALONOSETRON HCL INJECTION 0.25 MG/5ML
0.2500 mg | Freq: Once | INTRAVENOUS | Status: AC
  Administered 2021-07-30: 0.25 mg via INTRAVENOUS
  Filled 2021-07-30: qty 5

## 2021-07-30 MED ORDER — POTASSIUM CHLORIDE IN NACL 20-0.9 MEQ/L-% IV SOLN
Freq: Once | INTRAVENOUS | Status: AC
  Filled 2021-07-30: qty 1000

## 2021-07-30 MED ORDER — SODIUM CHLORIDE 0.9 % IV SOLN
500.0000 mg/m2 | Freq: Once | INTRAVENOUS | Status: AC
  Administered 2021-07-30: 1000 mg via INTRAVENOUS
  Filled 2021-07-30: qty 40

## 2021-07-30 MED ORDER — SODIUM CHLORIDE 0.9 % IV SOLN
74.0000 mg/m2 | Freq: Once | INTRAVENOUS | Status: AC
  Administered 2021-07-30: 150 mg via INTRAVENOUS
  Filled 2021-07-30: qty 150

## 2021-07-30 MED ORDER — SODIUM CHLORIDE 0.9 % IV SOLN
10.0000 mg | Freq: Once | INTRAVENOUS | Status: AC
  Administered 2021-07-30: 10 mg via INTRAVENOUS
  Filled 2021-07-30: qty 10

## 2021-07-30 NOTE — Progress Notes (Signed)
[facility] [phone]   [phone]  OFFICE PROGRESS NOTE  Bill Bott, MD [address] [place] [address]  DIAGNOSIS: Stage IIb (T3, N0, MX) non-small cell lung cancer, adenocarcinoma with no actionable mutations and negative PD-L1 expression.  PRIOR THERAPY:  status post right lower lobectomy with lymph node dissection under the care of Dr. Roxan Hockey on April 01, 2021.  CURRENT THERAPY: systemic chemotherapy with cisplatin 75 Mg/M2 and Alimta 500 Mg/M2 every 3 weeks.  First cycle May 28, 2021.  Status post 3 cycles.  INTERVAL HISTORY: Bill [name] 61 y.o. male returns to the clinic today for follow-up visit.  The patient is feeling fine today with no concerning complaints.  He has been tolerating his adjuvant systemic chemotherapy fairly well except for dry heaves for a day or 2 after his treatment.  He denied having any current nausea, vomiting, diarrhea or constipation.  He has no headache or visual changes.  He has no chest pain, shortness of breath, cough or hemoptysis.  He is here today for evaluation before starting cycle #4 of his treatment.   MEDICAL HISTORY: Past Medical History:  Diagnosis Date   Arthritis    Diabetes mellitus ([place])    History of kidney stones    Hyperlipidemia    Hypertension    Lung mass    Tobacco abuse     ALLERGIES:  is allergic to [employer] dimeglumine] and penicillins.  MEDICATIONS:  Current Outpatient Medications  Medication Sig Dispense Refill   albuterol (VENTOLIN HFA) 108 (90 Base) MCG/ACT inhaler Inhale 2 puffs into the lungs every 6 (six) hours as needed for wheezing or shortness of breath. 8 g 2   atorvastatin (LIPITOR) 40 MG tablet Take 40 mg by mouth daily.     dexamethasone (DECADRON) 4 MG tablet 1 tablet p.o. twice daily the day before, day of and day after the chemotherapy 30 tablet 0   DULoxetine (CYMBALTA) 30 MG capsule Take 30 mg by mouth  daily.     folic acid (FOLVITE) 1 MG tablet Take 1 tablet (1 mg total) by mouth daily. 30 tablet 4   gabapentin (NEURONTIN) 400 MG capsule Take 400 mg by mouth 2 (two) times daily.     guaiFENesin (MUCINEX) 600 MG 12 hr tablet Take 1 tablet (600 mg total) by mouth 2 (two) times daily. Take if needed for cough or congestion. (Patient not taking: Reported on 07/09/2021)     lisinopril (ZESTRIL) 10 MG tablet Take 10 mg by mouth daily.     Melatonin 5 MG CAPS Take 5 mg by mouth at bedtime as needed (sleep).     meloxicam (MOBIC) 15 MG tablet Take 1 tablet by mouth daily.     metFORMIN (GLUCOPHAGE-XR) 500 MG 24 hr tablet Take 500 mg by mouth in the morning, at noon, and at bedtime.     methadone (DOLOPHINE) 10 MG tablet Take 20 mg by mouth in the morning, at noon, and at bedtime.     prochlorperazine (COMPAZINE) 10 MG tablet Take 1 tablet (10 mg total) by mouth every 6 (six) hours as needed for nausea or vomiting. 30 tablet 0   tadalafil (CIALIS) 5 MG tablet Take 1 tablet by mouth daily as needed for erectile dysfunction.     testosterone cypionate (DEPOTESTOSTERONE CYPIONATE) 200 MG/ML injection Inject 1 mL into the muscle every 14 (fourteen) days.     traMADol (ULTRAM) 50 MG tablet Take 1-2  tablets by mouth every 6 (six) hours as needed.     No current facility-administered medications for this visit.    SURGICAL HISTORY:  Past Surgical History:  Procedure Laterality Date   ANKLE SURGERY     BACK SURGERY     INTERCOSTAL NERVE BLOCK Right 04/01/2021   Procedure: INTERCOSTAL NERVE BLOCK;  Surgeon: [name], MD;  Location: [facility];  Service: Thoracic;  Laterality: Right;   KNEE CARTILAGE SURGERY     LYMPH NODE DISSECTION Right 04/01/2021   Procedure: LYMPH NODE DISSECTION;  Surgeon: [name], MD;  Location: [facility];  Service: Thoracic;  Laterality: Right;    REVIEW OF SYSTEMS:  A comprehensive review of systems was negative except for: Constitutional: positive for fatigue    PHYSICAL EXAMINATION: General appearance: alert, cooperative, fatigued, and no distress Head: Normocephalic, without obvious abnormality, atraumatic Neck: no adenopathy, no JVD, supple, symmetrical, trachea midline, and thyroid not enlarged, symmetric, no tenderness/mass/nodules Lymph nodes: Cervical, supraclavicular, and axillary nodes normal. Resp: clear to auscultation bilaterally Back: symmetric, no curvature. ROM normal. No CVA tenderness. Cardio: regular rate and rhythm, S1, S2 normal, no murmur, click, rub or gallop GI: soft, non-tender; bowel sounds normal; no masses,  no organomegaly Extremities: extremities normal, atraumatic, no cyanosis or edema  ECOG PERFORMANCE STATUS: 1 - Symptomatic but completely ambulatory  Blood pressure 139/82, pulse 65, temperature 97.8 F (36.6 C), temperature source Tympanic, resp. rate 19, height $RemoveBe'5\' 10"'MEejxlaeF$  (1.778 m), weight 174 lb 1.6 oz (79 kg), SpO2 100 %.  LABORATORY DATA: Lab Results  Component Value Date   WBC 4.8 07/09/2021   HGB 10.9 (L) 07/09/2021   HCT 32.9 (L) 07/09/2021   MCV 81.6 07/09/2021   PLT 240 07/09/2021      Chemistry      Component Value Date/Time   NA 137 07/09/2021 0844   K 4.7 07/09/2021 0844   CL 102 07/09/2021 0844   CO2 26 07/09/2021 0844   BUN 13 07/09/2021 0844   CREATININE 0.87 07/09/2021 0844      Component Value Date/Time   CALCIUM 9.3 07/09/2021 0844   ALKPHOS 105 07/09/2021 0844   AST 12 (L) 07/09/2021 0844   ALT 11 07/09/2021 0844   BILITOT 0.3 07/09/2021 0844       RADIOGRAPHIC STUDIES: No results found.  ASSESSMENT AND PLAN: This is a very pleasant 61 years old white male diagnosed with a stage IIb (T3, N0, M0) non-small cell lung cancer, adenocarcinoma with no actionable mutations and negative PD-L1 expression. He started adjuvant systemic chemotherapy with cisplatin 75 Mg/M2 and Alimta 500 Mg/M2 every 3 weeks on May 28, 2021.  Status post 3 cycles. The patient has been tolerating  this treatment well with no concerning adverse effect except for a day or 2 of dry heaves after his chemotherapy. I recommended for him to proceed with cycle #4 today as planned. I will see the patient back for follow-up visit in 1 months for evaluation with repeat CT scan of the chest for restaging of his disease. The patient was advised to call immediately if he has any other concerning symptoms in the interval. The patient voices understanding of current disease status and treatment options and is in agreement with the current care plan.  All questions were answered. The patient knows to call the clinic with any problems, questions or concerns. We can certainly see the patient much sooner if necessary.   Disclaimer: This note was dictated with voice recognition software. Similar sounding  words can inadvertently be transcribed and may not be corrected upon review.

## 2021-07-30 NOTE — Patient Instructions (Signed)
[facility]   Discharge Instructions: Thank you for choosing [facility] to provide your oncology and hematology care.   If you have a lab appointment with the [facility], please go directly to the [facility] and check in at the registration area.   Wear comfortable clothing and clothing appropriate for easy access to any Portacath or PICC line.   We strive to give you quality time with your provider. You may need to reschedule your appointment if you arrive late (15 or more minutes).  Arriving late affects you and other patients whose appointments are after yours.  Also, if you miss three or more appointments without notifying the office, you may be dismissed from the clinic at the providers discretion.      For prescription refill requests, have your pharmacy contact our office and allow 72 hours for refills to be completed.    Today you received the following chemotherapy and/or immunotherapy agents: Pemetrexed (Alimta) and Cisplatin      To help prevent nausea and vomiting after your treatment, we encourage you to take your nausea medication as directed.  BELOW ARE SYMPTOMS THAT SHOULD BE REPORTED IMMEDIATELY: *FEVER GREATER THAN 100.4 F (38 C) OR HIGHER *CHILLS OR SWEATING *NAUSEA AND VOMITING THAT IS NOT CONTROLLED WITH YOUR NAUSEA MEDICATION *UNUSUAL SHORTNESS OF BREATH *UNUSUAL BRUISING OR BLEEDING *URINARY PROBLEMS (pain or burning when urinating, or frequent urination) *BOWEL PROBLEMS (unusual diarrhea, constipation, pain near the anus) TENDERNESS IN MOUTH AND THROAT WITH OR WITHOUT PRESENCE OF ULCERS (sore throat, sores in mouth, or a toothache) UNUSUAL RASH, SWELLING OR PAIN  UNUSUAL VAGINAL DISCHARGE OR ITCHING   Items with * indicate a potential emergency and should be followed up as soon as possible or go to the Emergency Department if any problems should occur.  Please show the CHEMOTHERAPY ALERT CARD or IMMUNOTHERAPY  ALERT CARD at check-in to the Emergency Department and triage nurse.  Should you have questions after your visit or need to cancel or reschedule your appointment, please contact [facility]  Dept: [phone]  and follow the prompts.  Office hours are 8:00 a.m. to 4:30 p.m. Monday - Friday. Please note that voicemails left after 4:00 p.m. may not be returned until the following business day.  We are closed weekends and major holidays. You have access to a nurse at all times for urgent questions. Please call the main number to the clinic Dept: [phone] and follow the prompts.   For any non-urgent questions, you may also contact your provider using MyChart. We now offer e-Visits for anyone 15 and older to request care online for non-urgent symptoms. For details visit [email or web address].   Also download the MyChart app! Go to the app store, search "MyChart", open the app, select [facility], and log in with your MyChart username and password.  Due to Covid, a mask is required upon entering the hospital/clinic. If you do not have a mask, one will be given to you upon arrival. For doctor visits, patients may have 1 support person aged 52 or older with them. For treatment visits, patients cannot have anyone with them due to current Covid guidelines and our immunocompromised population.

## 2021-08-04 ENCOUNTER — Ambulatory Visit: Payer: BC Managed Care – PPO | Admitting: Thoracic Surgery (Cardiothoracic Vascular Surgery)

## 2021-08-06 ENCOUNTER — Inpatient Hospital Stay: Payer: BC Managed Care – PPO

## 2021-08-13 ENCOUNTER — Inpatient Hospital Stay: Payer: BC Managed Care – PPO

## 2021-08-18 NOTE — Progress Notes (Unsigned)
[facility] OFFICE PROGRESS NOTE  Bill Bott, MD [address] [name] [address]  DIAGNOSIS: Stage IIb (T3, N0, MX) non-small cell lung cancer, adenocarcinoma with no actionable mutations and negative PD-L1 expression.  PRIOR THERAPY: Status post right lower lobectomy with lymph node dissection under the care of Dr. Roxan Hockey on April 01, 2021.  CURRENT THERAPY: Systemic chemotherapy with cisplatin 75 Mg/M2 and Alimta 500 Mg/M2 every 3 weeks.  Last dose of treatment on 07/30/2021 status post 4 cycles  INTERVAL HISTORY: Bill [name] 61 y.o. male returns to clinic today for follow-up visit.  The patient is feeling ***without any concerning complaints except for ***.  On 07/30/2021, the patient underwent his last cycle of adjuvant chemotherapy.  He tolerated treatment fairly well except for some dry heaves.  Today, the patient denies any fever, chills, night sweats, or unexplained weight loss.  Denies any chest pain, shortness of breath, cough, or hemoptysis.  Denies any nausea, vomiting, diarrhea, or constipation.  Denies any headache or visual changes.  The patient recently had a restaging CT scan performed.  The patient is here today for evaluation and to review his scan results. MEDICAL HISTORY: Past Medical History:  Diagnosis Date   Arthritis    Diabetes mellitus ([place])    History of kidney stones    Hyperlipidemia    Hypertension    Lung mass    Tobacco abuse     ALLERGIES:  is allergic to [employer] dimeglumine] and penicillins.  MEDICATIONS:  Current Outpatient Medications  Medication Sig Dispense Refill   albuterol (VENTOLIN HFA) 108 (90 Base) MCG/ACT inhaler Inhale 2 puffs into the lungs every 6 (six) hours as needed for wheezing or shortness of breath. 8 g 2   atorvastatin (LIPITOR) 40 MG tablet Take 40 mg by mouth daily.     dexamethasone (DECADRON) 4 MG tablet 1 tablet p.o. twice daily the day before, day of and day  after the chemotherapy 30 tablet 0   DULoxetine (CYMBALTA) 30 MG capsule Take 30 mg by mouth daily.     folic acid (FOLVITE) 1 MG tablet Take 1 tablet (1 mg total) by mouth daily. 30 tablet 4   gabapentin (NEURONTIN) 400 MG capsule Take 400 mg by mouth 2 (two) times daily.     guaiFENesin (MUCINEX) 600 MG 12 hr tablet Take 1 tablet (600 mg total) by mouth 2 (two) times daily. Take if needed for cough or congestion. (Patient not taking: Reported on 07/09/2021)     lisinopril (ZESTRIL) 10 MG tablet Take 10 mg by mouth daily.     Melatonin 5 MG CAPS Take 5 mg by mouth at bedtime as needed (sleep).     meloxicam (MOBIC) 15 MG tablet Take 1 tablet by mouth daily.     metFORMIN (GLUCOPHAGE-XR) 500 MG 24 hr tablet Take 500 mg by mouth in the morning, at noon, and at bedtime.     methadone (DOLOPHINE) 10 MG tablet Take 20 mg by mouth in the morning, at noon, and at bedtime.     prochlorperazine (COMPAZINE) 10 MG tablet Take 1 tablet (10 mg total) by mouth every 6 (six) hours as needed for nausea or vomiting. 30 tablet 0   tadalafil (CIALIS) 5 MG tablet Take 1 tablet by mouth daily as needed for erectile dysfunction.     testosterone cypionate (DEPOTESTOSTERONE CYPIONATE) 200 MG/ML injection Inject 1 mL into the muscle every 14 (fourteen) days.     traMADol Veatrice Bourbon)  50 MG tablet Take 1-2 tablets by mouth every 6 (six) hours as needed.     No current facility-administered medications for this visit.    SURGICAL HISTORY:  Past Surgical History:  Procedure Laterality Date   ANKLE SURGERY     BACK SURGERY     INTERCOSTAL NERVE BLOCK Right 04/01/2021   Procedure: INTERCOSTAL NERVE BLOCK;  Surgeon: [name], MD;  Location: [facility];  Service: Thoracic;  Laterality: Right;   KNEE CARTILAGE SURGERY     LYMPH NODE DISSECTION Right 04/01/2021   Procedure: LYMPH NODE DISSECTION;  Surgeon: [name], MD;  Location: [facility];  Service: Thoracic;  Laterality: Right;    REVIEW OF SYSTEMS:    Review of Systems  Constitutional: Negative for appetite change, chills, fatigue, fever and unexpected weight change.  HENT:   Negative for mouth sores, nosebleeds, sore throat and trouble swallowing.   Eyes: Negative for eye problems and icterus.  Respiratory: Negative for cough, hemoptysis, shortness of breath and wheezing.   Cardiovascular: Negative for chest pain and leg swelling.  Gastrointestinal: Negative for abdominal pain, constipation, diarrhea, nausea and vomiting.  Genitourinary: Negative for bladder incontinence, difficulty urinating, dysuria, frequency and hematuria.   Musculoskeletal: Negative for back pain, gait problem, neck pain and neck stiffness.  Skin: Negative for itching and rash.  Neurological: Negative for dizziness, extremity weakness, gait problem, headaches, light-headedness and seizures.  Hematological: Negative for adenopathy. Does not bruise/bleed easily.  Psychiatric/Behavioral: Negative for confusion, depression and sleep disturbance. The patient is not nervous/anxious.     PHYSICAL EXAMINATION:  There were no vitals taken for this visit.  ECOG PERFORMANCE STATUS: {CHL ONC ECOG [id number]  Physical Exam  Constitutional: Oriented to person, place, and time and well-developed, well-nourished, and in no distress. No distress.  HENT:  Head: Normocephalic and atraumatic.  Mouth/Throat: Oropharynx is clear and moist. No oropharyngeal exudate.  Eyes: Conjunctivae are normal. Right eye exhibits no discharge. Left eye exhibits no discharge. No scleral icterus.  Neck: Normal range of motion. Neck supple.  Cardiovascular: Normal rate, regular rhythm, normal heart sounds and intact distal pulses.   Pulmonary/Chest: Effort normal and breath sounds normal. No respiratory distress. No wheezes. No rales.  Abdominal: Soft. Bowel sounds are normal. Exhibits no distension and no mass. There is no tenderness.  Musculoskeletal: Normal range of motion. Exhibits no  edema.  Lymphadenopathy:    No cervical adenopathy.  Neurological: Alert and oriented to person, place, and time. Exhibits normal muscle tone. Gait normal. Coordination normal.  Skin: Skin is warm and dry. No rash noted. Not diaphoretic. No erythema. No pallor.  Psychiatric: Mood, memory and judgment normal.  Vitals reviewed.  LABORATORY DATA: Lab Results  Component Value Date   WBC 4.2 07/30/2021   HGB 10.1 (L) 07/30/2021   HCT 30.6 (L) 07/30/2021   MCV 82.9 07/30/2021   PLT 263 07/30/2021      Chemistry      Component Value Date/Time   NA 138 07/30/2021 0856   K 4.1 07/30/2021 0856   CL 102 07/30/2021 0856   CO2 29 07/30/2021 0856   BUN 21 (H) 07/30/2021 0856   CREATININE 0.93 07/30/2021 0856      Component Value Date/Time   CALCIUM 9.4 07/30/2021 0856   ALKPHOS 105 07/30/2021 0856   AST 15 07/30/2021 0856   ALT 10 07/30/2021 0856   BILITOT 0.3 07/30/2021 0856       RADIOGRAPHIC STUDIES:  No results found.   ASSESSMENT/PLAN:  This  is a very pleasant 61 year old Caucasian male diagnosed with stage IIb (T3, N0, M0) non-small cell lung cancer, adenocarcinoma.  The patient has negative PD-L1 expression and no actionable mutations.  The patient is status post right lower lobectomy with lymph node dissection under the care of Dr. Roxan Hockey on April 01, 2021.  The patient completed 4 cycles of adjuvant systemic chemotherapy with cisplatin 75 mg per metered squared and Alimta 500 mg per metered squared IV every 3 weeks.  Last dose of treatment was on 07/30/2021.  The patient recently had a restaging CT scan performed.  Dr. Julien Nordmann personally and independently reviewed the scan and discussed the results with the patient today.  The scan showed ***Dr. Julien Nordmann recommends that the patient continue on observation with a restaging CT scan the chest in 3 months.  We will see him back for follow-up visit at that time for evaluation to review his scan results.  Make sure he does  not need/appointments  The patient was advised to call immediately if he has any concerning symptoms in the interval. The patient voices understanding of current disease status and treatment options and is in agreement with the current care plan. All questions were answered. The patient knows to call the clinic with any problems, questions or concerns. We can certainly see the patient much sooner if necessary   No orders of the defined types were placed in this encounter.    I spent {CHL ONC TIME VISIT - [id number]} counseling the patient face to face. The total time spent in the appointment was {CHL ONC TIME VISIT - HKUVJ:[id number]}.  Brittan Mapel L Nevah Dalal, PA-C 08/18/21

## 2021-08-20 ENCOUNTER — Inpatient Hospital Stay: Payer: BC Managed Care – PPO | Admitting: Physician Assistant

## 2021-08-20 ENCOUNTER — Inpatient Hospital Stay: Payer: BC Managed Care – PPO

## 2021-08-20 ENCOUNTER — Ambulatory Visit: Payer: BC Managed Care – PPO

## 2021-08-21 ENCOUNTER — Telehealth: Payer: Self-pay | Admitting: Internal Medicine

## 2021-08-21 NOTE — Telephone Encounter (Signed)
.  Called patient to schedule appointment per 2/21 inbasket, patient is aware of date and time.

## 2021-08-27 ENCOUNTER — Other Ambulatory Visit: Payer: BC Managed Care – PPO

## 2021-08-27 ENCOUNTER — Encounter (HOSPITAL_COMMUNITY): Payer: Self-pay

## 2021-08-27 ENCOUNTER — Inpatient Hospital Stay: Payer: BC Managed Care – PPO | Attending: Internal Medicine

## 2021-08-27 ENCOUNTER — Other Ambulatory Visit: Payer: Self-pay

## 2021-08-27 ENCOUNTER — Ambulatory Visit (HOSPITAL_COMMUNITY)
Admission: RE | Admit: 2021-08-27 | Discharge: 2021-08-27 | Disposition: A | Discharge status: Home / Self Care | Payer: BC Managed Care – PPO | Source: Ambulatory Visit | Attending: Internal Medicine | Admitting: Internal Medicine

## 2021-08-27 DIAGNOSIS — C3491 Malignant neoplasm of unspecified part of right bronchus or lung: Secondary | ICD-10-CM

## 2021-08-27 DIAGNOSIS — C3490 Malignant neoplasm of unspecified part of unspecified bronchus or lung: Secondary | ICD-10-CM | POA: Diagnosis present

## 2021-08-27 DIAGNOSIS — C3431 Malignant neoplasm of lower lobe, right bronchus or lung: Secondary | ICD-10-CM | POA: Insufficient documentation

## 2021-08-27 LAB — CMP (CANCER CENTER ONLY)
ALT: 9 U/L (ref 0–44)
AST: 13 U/L — ABNORMAL LOW (ref 15–41)
Albumin: 3.6 g/dL (ref 3.5–5.0)
Alkaline Phosphatase: 89 U/L (ref 38–126)
Anion gap: 4 — ABNORMAL LOW (ref 5–15)
BUN: 14 mg/dL (ref 8–23)
CO2: 31 mmol/L (ref 22–32)
Calcium: 8.8 mg/dL — ABNORMAL LOW (ref 8.9–10.3)
Chloride: 103 mmol/L (ref 98–111)
Creatinine: 0.95 mg/dL (ref 0.61–1.24)
GFR, Estimated: >60 mL/min (ref >60)
Glucose, Bld: 222 mg/dL — ABNORMAL HIGH (ref 70–99)
Potassium: 4.5 mmol/L (ref 3.5–5.1)
Sodium: 138 mmol/L (ref 135–145)
Total Bilirubin: 0.3 mg/dL (ref 0.3–1.2)
Total Protein: 6.4 g/dL — ABNORMAL LOW (ref 6.5–8.1)

## 2021-08-27 LAB — CBC WITH DIFFERENTIAL (CANCER CENTER ONLY)
Abs Immature Granulocytes: 0.02 10*3/uL (ref 0.00–0.07)
Basophils Absolute: 0 10*3/uL (ref 0.0–0.1)
Basophils Relative: 0 %
Eosinophils Absolute: 0.1 10*3/uL (ref 0.0–0.5)
Eosinophils Relative: 1 %
HCT: 27.3 % — ABNORMAL LOW (ref 39.0–52.0)
Hemoglobin: 9.1 g/dL — ABNORMAL LOW (ref 13.0–17.0)
Immature Granulocytes: 0 %
Lymphocytes Relative: 19 %
Lymphs Abs: 1.2 10*3/uL (ref 0.7–4.0)
MCH: 28.8 pg (ref 26.0–34.0)
MCHC: 33.3 g/dL (ref 30.0–36.0)
MCV: 86.4 fL (ref 80.0–100.0)
Monocytes Absolute: 0.6 10*3/uL (ref 0.1–1.0)
Monocytes Relative: 9 %
Neutro Abs: 4.4 10*3/uL (ref 1.7–7.7)
Neutrophils Relative %: 71 %
Platelet Count: 174 10*3/uL (ref 150–400)
RBC: 3.16 MIL/uL — ABNORMAL LOW (ref 4.22–5.81)
RDW: 17.2 % — ABNORMAL HIGH (ref 11.5–15.5)
WBC Count: 6.2 10*3/uL (ref 4.0–10.5)
nRBC: 0 % (ref 0.0–0.2)

## 2021-08-27 LAB — MAGNESIUM: Magnesium: 1.7 mg/dL (ref 1.7–2.4)

## 2021-08-27 MED ORDER — SODIUM CHLORIDE (PF) 0.9 % IJ SOLN
INTRAMUSCULAR | Status: AC
  Filled 2021-08-27: qty 50

## 2021-08-27 MED ORDER — IOHEXOL 300 MG/ML  SOLN
75.0000 mL | Freq: Once | INTRAMUSCULAR | Status: AC | PRN
  Administered 2021-08-27: 75 mL via INTRAVENOUS

## 2021-08-31 NOTE — Progress Notes (Signed)
[facility] OFFICE PROGRESS NOTE  [name], MD [address] [place] [id number]  DIAGNOSIS: Stage IIb (T3, N0, MX) non-small cell lung cancer, adenocarcinoma with no actionable mutations and negative PD-L1 expression.  PRIOR THERAPY:   1) Status post right lower lobectomy with lymph node dissection under the care of Dr. Roxan Hockey on April 01, 2021. 2) Systemic chemotherapy with cisplatin 75 Mg/M2 and Alimta 500 Mg/M2 every 3 weeks.  Last dose 07/30/21.  Status post 4 cycles.  CURRENT THERAPY: Observation   INTERVAL HISTORY: Bill [name] 61 y.o. male returns to the clinic today for a follow-up visit accompanied by his wife. The patient is status post a right lower lobectomy in October 2022.  He underwent 4 cycles of adjuvant chemotherapy which she completed on 07/30/2021.  Overall, he tolerated this fairly well except the last treatment was more challenging for him. He had more nausea/vomiting with the most recent treatment. His nausea/vomiting subsided about 2 weeks ago. Today, he denies any recent fever, chills, night sweats, or unexplained weight loss. He reports stable dyspnea on exertion. Reports some post surgical discomfort which is the same. Denies any cough or hemoptysis.  Denies any more recent nausea, vomiting, diarrhea, or constipation.  Denies any headache or visual changes.  The patient recently had a restaging CT scan performed.  The patient is here today for evaluations and to review his scan results.   MEDICAL HISTORY: Past Medical History:  Diagnosis Date   Arthritis    Diabetes mellitus ([place])    History of kidney stones    Hyperlipidemia    Hypertension    Lung mass    Tobacco abuse     ALLERGIES:  is allergic to [employer] dimeglumine] and penicillins.  MEDICATIONS:  Current Outpatient Medications  Medication Sig Dispense Refill   albuterol (VENTOLIN HFA) 108 (90 Base) MCG/ACT inhaler Inhale 2 puffs into the  lungs every 6 (six) hours as needed for wheezing or shortness of breath. 8 g 2   atorvastatin (LIPITOR) 40 MG tablet Take 40 mg by mouth daily.     DULoxetine (CYMBALTA) 30 MG capsule Take 30 mg by mouth daily.     gabapentin (NEURONTIN) 400 MG capsule Take 400 mg by mouth 2 (two) times daily.     guaiFENesin (MUCINEX) 600 MG 12 hr tablet Take 1 tablet (600 mg total) by mouth 2 (two) times daily. Take if needed for cough or congestion.     lisinopril (ZESTRIL) 10 MG tablet Take 10 mg by mouth daily.     Melatonin 5 MG CAPS Take 5 mg by mouth at bedtime as needed (sleep).     meloxicam (MOBIC) 15 MG tablet Take 1 tablet by mouth daily.     metFORMIN (GLUCOPHAGE-XR) 500 MG 24 hr tablet Take 500 mg by mouth in the morning, at noon, and at bedtime.     methadone (DOLOPHINE) 10 MG tablet Take 20 mg by mouth in the morning, at noon, and at bedtime.     tadalafil (CIALIS) 5 MG tablet Take 1 tablet by mouth daily as needed for erectile dysfunction.     testosterone cypionate (DEPOTESTOSTERONE CYPIONATE) 200 MG/ML injection Inject 1 mL into the muscle every 14 (fourteen) days.     traMADol (ULTRAM) 50 MG tablet Take 1-2 tablets by mouth every 6 (six) hours as needed.     dexamethasone (DECADRON) 4 MG tablet 1 tablet p.o. twice daily the day before, day of and day  after the chemotherapy (Patient not taking: Reported on 09/03/2021) 30 tablet 0   folic acid (FOLVITE) 1 MG tablet Take 1 tablet (1 mg total) by mouth daily. (Patient not taking: Reported on 09/03/2021) 30 tablet 4   prochlorperazine (COMPAZINE) 10 MG tablet Take 1 tablet (10 mg total) by mouth every 6 (six) hours as needed for nausea or vomiting. (Patient not taking: Reported on 09/03/2021) 30 tablet 0   No current facility-administered medications for this visit.    SURGICAL HISTORY:  Past Surgical History:  Procedure Laterality Date   ANKLE SURGERY     BACK SURGERY     INTERCOSTAL NERVE BLOCK Right 04/01/2021   Procedure: INTERCOSTAL NERVE  BLOCK;  Surgeon: [name], MD;  Location: [facility];  Service: Thoracic;  Laterality: Right;   KNEE CARTILAGE SURGERY     LYMPH NODE DISSECTION Right 04/01/2021   Procedure: LYMPH NODE DISSECTION;  Surgeon: [name], MD;  Location: [employer];  Service: Thoracic;  Laterality: Right;    REVIEW OF SYSTEMS:   Review of Systems  Constitutional: Negative for appetite change, chills, fatigue, fever and unexpected weight change.  HENT: Negative for mouth sores, nosebleeds, sore throat and trouble swallowing.   Eyes: Negative for eye problems and icterus.  Respiratory: Positive for mild dyspnea on exertion. Negative for cough, hemoptysis, and wheezing.   Cardiovascular: Positive for mild post-surgical chest discomfort. Negative for leg swelling.  Gastrointestinal: Negative for abdominal pain, constipation, diarrhea, nausea and vomiting.  Genitourinary: Negative for bladder incontinence, difficulty urinating, dysuria, frequency and hematuria.   Musculoskeletal: Negative for back pain, gait problem, neck pain and neck stiffness.  Skin: Negative for itching and rash.  Neurological: Negative for dizziness, extremity weakness, gait problem, headaches, light-headedness and seizures.  Hematological: Negative for adenopathy. Does not bruise/bleed easily.  Psychiatric/Behavioral: Negative for confusion, depression and sleep disturbance. The patient is not nervous/anxious.     PHYSICAL EXAMINATION:  Blood pressure (!) 157/77, pulse 88, temperature 97.9 F (36.6 C), temperature source Oral, resp. rate 18, height _0  (1.778 m), weight 184 lb 1.6 oz (83.5 kg), SpO2 98 %.  ECOG PERFORMANCE STATUS: 1 - Symptomatic but completely ambulatory  Physical Exam  Constitutional: Oriented to person, place, and time and well-developed, well-nourished, and in no distress.  HENT:  Head: Normocephalic and atraumatic.  Mouth/Throat: Oropharynx is clear and moist. No oropharyngeal exudate.  Eyes:  Conjunctivae are normal. Right eye exhibits no discharge. Left eye exhibits no discharge. No scleral icterus.  Neck: Normal range of motion. Neck supple.  Cardiovascular: Normal rate, regular rhythm, normal heart sounds and intact distal pulses.   Pulmonary/Chest: Effort normal and breath sounds normal. No respiratory distress. No wheezes. No rales.  Abdominal: Soft. Bowel sounds are normal. Exhibits no distension and no mass. There is no tenderness.  Musculoskeletal: Normal range of motion. Exhibits no edema.  Lymphadenopathy:    No cervical adenopathy.  Neurological: Alert and oriented to person, place, and time. Exhibits normal muscle tone. Gait normal. Coordination normal.  Skin: Skin is warm and dry. No rash noted. Not diaphoretic. No erythema. No pallor.  Psychiatric: Mood, memory and judgment normal.  Vitals reviewed.  LABORATORY DATA: Lab Results  Component Value Date   WBC 6.2 08/27/2021   HGB 9.1 (L) 08/27/2021   HCT 27.3 (L) 08/27/2021   MCV 86.4 08/27/2021   PLT 174 08/27/2021      Chemistry      Component Value Date/Time   NA 138 08/27/2021 0950  K 4.5 08/27/2021 0950   CL 103 08/27/2021 0950   CO2 31 08/27/2021 0950   BUN 14 08/27/2021 0950   CREATININE 0.95 08/27/2021 0950      Component Value Date/Time   CALCIUM 8.8 (L) 08/27/2021 0950   ALKPHOS 89 08/27/2021 0950   AST 13 (L) 08/27/2021 0950   ALT 9 08/27/2021 0950   BILITOT 0.3 08/27/2021 0950       RADIOGRAPHIC STUDIES:  CT Chest W Contrast  Result Date: 08/28/2021 CLINICAL DATA:  61 year old male with history of non-small cell lung cancer. Status post right lower lobectomy. Chemotherapy complete. Right-sided chest pain and shortness of breath with exertion. Staging examination. * onc * EXAM: CT CHEST WITH CONTRAST TECHNIQUE: Multidetector CT imaging of the chest was performed during intravenous contrast administration. RADIATION DOSE REDUCTION: This exam was performed according to the departmental  dose-optimization program which includes automated exposure control, adjustment of the mA and/or kV according to patient size and/or use of iterative reconstruction technique. CONTRAST:  37m OMNIPAQUE IOHEXOL 300 MG/ML  SOLN COMPARISON:  Chest CT 11/17/2020. FINDINGS: Cardiovascular: Heart size is normal. There is no significant pericardial fluid, thickening or pericardial calcification. There is aortic atherosclerosis, as well as atherosclerosis of the great vessels of the mediastinum and the coronary arteries, including calcified atherosclerotic plaque in the left main, left anterior descending, left circumflex and right coronary arteries. Mediastinum/Nodes: Mediastinal or no pathologically enlarged hilar lymph nodes. Esophagus is unremarkable in appearance. No axillary lymphadenopathy. Lungs/Pleura: Postoperative changes of right lower lobectomy are noted, with compensatory hyperexpansion of the right upper and middle lobes. No suspicious appearing pulmonary nodules or masses are noted. No acute consolidative airspace disease. Small to moderate volume of right-sided pleural fluid with some pleural thickening and high density material along the diaphragmatic pleural surface in the inferior right hemithorax, which may represent suture material and/or dystrophic calcifications. This pleural thickening is most evident on axial images 112 and 113 of series 2 where some of this soft tissue appears to extend into the posteromedial aspect of the right chest wall, with a rather mass-like appearance, estimated to measure approximately 7.5 x 2.1 cm at this level (axial image 113 of series 2). Other faintly calcified pleural plaques are also noted posteriorly in the right hemithorax. Upper Abdomen: Unremarkable. Musculoskeletal: Plate and screw fixation hardware in the manubrium and upper sternum are incidentally noted. There are no aggressive appearing lytic or blastic lesions noted in the visualized portions of the  skeleton. IMPRESSION: 1. Status post right lower lobectomy. There is a chronic postoperative fluid collection in the right pleural space with extensive right-sided pleural thickening, some of which is rather mass-like in appearance, with potential invasion of the right chest wall. This could simply represent postoperative scarring, as this is the first postoperative study we have for evaluation. However, the possibility of pleural metastasis is difficult to entirely exclude. If there is clinical concern for recurrent disease, further evaluation with PET-CT should be considered at this time. 2. Aortic atherosclerosis, in addition to left main and three-vessel coronary artery disease. Please note that although the presence of coronary artery calcium documents the presence of coronary artery disease, the severity of this disease and any potential stenosis cannot be assessed on this non-gated CT examination. Assessment for potential risk factor modification, dietary therapy or pharmacologic therapy may be warranted, if clinically indicated. Aortic Atherosclerosis (ICD10-I70.0). Electronically Signed   By: DVinnie LangtonM.D.   On: 08/28/2021 07:09     ASSESSMENT/PLAN:  This is  a very pleasant 61 year old Caucasian male diagnosed with stage IIb (T3, N0, M0) non-small cell lung cancer, adenocarcinoma.  He is negative for any actionable mutations.  He has negative PD-L1 expression.  The patient is status post right lower lobectomy and lymph node dissection and October 2022 under the care of Dr. Roxan Hockey.   He then completed 4 cycles of adjuvant chemotherapy with cisplatin 75 mg per metered squared and Alimta 500 mg per metered squared IV every 3 weeks.  Last dose on 07/30/2021.   The patient recently had a restaging CT scan performed.  Dr. Julien Nordmann personally and independently reviewed the scan and discussed results with the patient today.  The scan showed chronic postoperative fluid collections in the right  pleural space with extensive right-sided pleural thickening some of which is rather masslike in appearance with potential invasion of the right chest wall which could simply represent postoperative scarring however cannot exclude pleural metastasis entirely.  Dr. Julien Nordmann gave the patient the option of ordering a PET scan now to evaluate this vs. Continuing on observation with short term follow up in 2 months with a CT scan. Of course, Dr. Julien Nordmann if this area looked worse on his next scan, he would recommend a PET scan at that time.   The patient opted to continue on observation with a CT scan in 2 months.   We will reach out to Dr. Roxan Hockey to get his opinion on the scan and if this is expected post-surgical changes or if he feels this is highly suspicious and should be evaluated via PET sooner.   I told the patient I would call them tomorrow or next week with Dr. Leonarda Salon recommendation.   For now, we will plan on seeing him in 2 months for evaluation and to review his CT scan results.   The patient was given a work note to return to work in 2 weeks.   The patient was advised to call immediately if he has any concerning symptoms in the interval. The patient voices understanding of current disease status and treatment options and is in agreement with the current care plan. All questions were answered. The patient knows to call the clinic with any problems, questions or concerns. We can certainly see the patient much sooner if necessary     Orders Placed This Encounter  Procedures   CT Chest W Contrast    Standing Status:   Future    Standing Expiration Date:   09/03/2022    Order Specific Question:   If indicated for the ordered procedure, I authorize the administration of contrast media per Radiology protocol    Answer:   Yes    Order Specific Question:   Preferred imaging location?    Answer:   [place],  [facility] 09/03/21  ADDENDUM: Hematology/Oncology Attending: I had a face-to-face encounter with the patient today.  I reviewed his record, lab, scan and recommended his care plan.  This is a very pleasant 61 years old white male diagnosed with a stage IIb (T3, N0, M0) non-small cell lung cancer, adenocarcinoma with no actionable mutations and negative PD-L1 expression.  The patient is status post right lower lobectomy with lymph node dissection under the care of Dr. Roxan Hockey in October 2022. He also completed 4 cycles of adjuvant systemic chemotherapy with cisplatin and Alimta and tolerated his treatment well except for the last cycle that was a little bit more rough on the patient. He had repeat  CT scan of the chest performed recently.  I personally and independently reviewed the scan images and discussed the result and showed the images to the patient and his wife today. His scan showed no concerning findings for disease recurrence but there was chronic postoperative fluid collection and extensive right-sided pleural thickening that is more than expected after his surgery. I had a lengthy discussion with the patient and his wife about his condition and further evaluation for this finding.  I discussed with the patient the option of continuous observation and repeat CT scan of the chest in 2 months for reevaluation of this area and to rule out disease recurrence versus proceeding with a PET scan.  The patient would like to wait for 2 months for the CT scan of the chest. We will also discuss his case with Dr. Roxan Hockey for his opinion regarding the thickening of the right-sided pleural area. He was advised to call immediately if he has any other concerning issues in the interval especially worsening dyspnea, chest pain or worsening cough. The total time spent in the appointment was 30 minutes. Disclaimer: This note was dictated with voice recognition software. Similar sounding words can inadvertently  be transcribed and may be missed upon review. Curt Bears MD

## 2021-09-03 ENCOUNTER — Inpatient Hospital Stay: Payer: BC Managed Care – PPO | Admitting: Internal Medicine

## 2021-09-03 ENCOUNTER — Inpatient Hospital Stay (HOSPITAL_BASED_OUTPATIENT_CLINIC_OR_DEPARTMENT_OTHER): Payer: BC Managed Care – PPO | Admitting: Physician Assistant

## 2021-09-03 ENCOUNTER — Telehealth: Payer: Self-pay | Admitting: Physician Assistant

## 2021-09-03 ENCOUNTER — Other Ambulatory Visit: Payer: BC Managed Care – PPO

## 2021-09-03 ENCOUNTER — Other Ambulatory Visit: Payer: Self-pay

## 2021-09-03 VITALS — BP 157/77 | HR 88 | Temp 97.9°F | Resp 18 | Ht 70.0 in | Wt 184.1 lb

## 2021-09-03 DIAGNOSIS — C3491 Malignant neoplasm of unspecified part of right bronchus or lung: Secondary | ICD-10-CM

## 2021-09-03 DIAGNOSIS — Z5111 Encounter for antineoplastic chemotherapy: Secondary | ICD-10-CM

## 2021-09-03 DIAGNOSIS — C3431 Malignant neoplasm of lower lobe, right bronchus or lung: Secondary | ICD-10-CM | POA: Diagnosis not present

## 2021-09-03 NOTE — Telephone Encounter (Signed)
I called the patient back to let him know that Dr. Roxan Hockey reviewed the images and also felt the changes on the scan could be post-operative scarring, although hard to be sure but it is reasonable to have close follow up CT in 2 months. I called the patient to relay this information. Unable to reach him. Left a voicemail. We will keep the plan the same for now unless the patient changes his mind. I left a call back number in case they have any questions about this and would like to discuss further.  ?

## 2021-09-10 ENCOUNTER — Inpatient Hospital Stay: Payer: BC Managed Care – PPO

## 2021-09-10 ENCOUNTER — Ambulatory Visit: Payer: BC Managed Care – PPO

## 2021-09-10 ENCOUNTER — Inpatient Hospital Stay: Payer: BC Managed Care – PPO | Admitting: Internal Medicine

## 2021-10-26 ENCOUNTER — Inpatient Hospital Stay: Payer: BC Managed Care – PPO | Attending: Internal Medicine

## 2021-10-26 DIAGNOSIS — Z87891 Personal history of nicotine dependence: Secondary | ICD-10-CM | POA: Insufficient documentation

## 2021-10-26 DIAGNOSIS — C3431 Malignant neoplasm of lower lobe, right bronchus or lung: Secondary | ICD-10-CM | POA: Insufficient documentation

## 2021-10-26 DIAGNOSIS — R079 Chest pain, unspecified: Secondary | ICD-10-CM | POA: Insufficient documentation

## 2021-10-26 DIAGNOSIS — R059 Cough, unspecified: Secondary | ICD-10-CM | POA: Insufficient documentation

## 2021-10-26 DIAGNOSIS — Z79899 Other long term (current) drug therapy: Secondary | ICD-10-CM | POA: Insufficient documentation

## 2021-10-28 ENCOUNTER — Ambulatory Visit (HOSPITAL_COMMUNITY)
Admission: RE | Admit: 2021-10-28 | Discharge: 2021-10-28 | Disposition: A | Discharge status: Home / Self Care | Payer: BC Managed Care – PPO | Source: Ambulatory Visit | Attending: Physician Assistant | Admitting: Physician Assistant

## 2021-10-28 ENCOUNTER — Inpatient Hospital Stay (HOSPITAL_BASED_OUTPATIENT_CLINIC_OR_DEPARTMENT_OTHER): Payer: BC Managed Care – PPO | Admitting: Internal Medicine

## 2021-10-28 ENCOUNTER — Telehealth: Payer: Self-pay

## 2021-10-28 ENCOUNTER — Inpatient Hospital Stay: Payer: BC Managed Care – PPO

## 2021-10-28 ENCOUNTER — Other Ambulatory Visit: Payer: Self-pay

## 2021-10-28 VITALS — BP 134/74 | HR 98 | Temp 98.1°F | Resp 18 | Wt 174.5 lb

## 2021-10-28 DIAGNOSIS — C3431 Malignant neoplasm of lower lobe, right bronchus or lung: Secondary | ICD-10-CM | POA: Diagnosis not present

## 2021-10-28 DIAGNOSIS — R059 Cough, unspecified: Secondary | ICD-10-CM | POA: Diagnosis not present

## 2021-10-28 DIAGNOSIS — C3491 Malignant neoplasm of unspecified part of right bronchus or lung: Secondary | ICD-10-CM | POA: Insufficient documentation

## 2021-10-28 DIAGNOSIS — Z79899 Other long term (current) drug therapy: Secondary | ICD-10-CM | POA: Diagnosis not present

## 2021-10-28 DIAGNOSIS — R079 Chest pain, unspecified: Secondary | ICD-10-CM | POA: Diagnosis not present

## 2021-10-28 DIAGNOSIS — C3490 Malignant neoplasm of unspecified part of unspecified bronchus or lung: Secondary | ICD-10-CM

## 2021-10-28 DIAGNOSIS — Z87891 Personal history of nicotine dependence: Secondary | ICD-10-CM | POA: Diagnosis not present

## 2021-10-28 LAB — CBC WITH DIFFERENTIAL (CANCER CENTER ONLY)
Abs Immature Granulocytes: 0.04 10*3/uL (ref 0.00–0.07)
Basophils Absolute: 0 10*3/uL (ref 0.0–0.1)
Basophils Relative: 0 %
Eosinophils Absolute: 0.2 10*3/uL (ref 0.0–0.5)
Eosinophils Relative: 3 %
HCT: 38.2 % — ABNORMAL LOW (ref 39.0–52.0)
Hemoglobin: 12.5 g/dL — ABNORMAL LOW (ref 13.0–17.0)
Immature Granulocytes: 1 %
Lymphocytes Relative: 23 %
Lymphs Abs: 1.9 10*3/uL (ref 0.7–4.0)
MCH: 27.2 pg (ref 26.0–34.0)
MCHC: 32.7 g/dL (ref 30.0–36.0)
MCV: 83 fL (ref 80.0–100.0)
Monocytes Absolute: 0.5 10*3/uL (ref 0.1–1.0)
Monocytes Relative: 7 %
Neutro Abs: 5.3 10*3/uL (ref 1.7–7.7)
Neutrophils Relative %: 66 %
Platelet Count: 230 10*3/uL (ref 150–400)
RBC: 4.6 MIL/uL (ref 4.22–5.81)
RDW: 12.9 % (ref 11.5–15.5)
WBC Count: 8 10*3/uL (ref 4.0–10.5)
nRBC: 0 % (ref 0.0–0.2)

## 2021-10-28 LAB — CMP (CANCER CENTER ONLY)
ALT: 28 U/L (ref 0–44)
AST: 21 U/L (ref 15–41)
Albumin: 4.2 g/dL (ref 3.5–5.0)
Alkaline Phosphatase: 121 U/L (ref 38–126)
Anion gap: 6 (ref 5–15)
BUN: 22 mg/dL (ref 8–23)
CO2: 29 mmol/L (ref 22–32)
Calcium: 9.5 mg/dL (ref 8.9–10.3)
Chloride: 100 mmol/L (ref 98–111)
Creatinine: 1.1 mg/dL (ref 0.61–1.24)
GFR, Estimated: >60 mL/min (ref >60)
Glucose, Bld: 120 mg/dL — ABNORMAL HIGH (ref 70–99)
Potassium: 4.6 mmol/L (ref 3.5–5.1)
Sodium: 135 mmol/L (ref 135–145)
Total Bilirubin: 0.3 mg/dL (ref 0.3–1.2)
Total Protein: 7.7 g/dL (ref 6.5–8.1)

## 2021-10-28 LAB — POCT I-STAT CREATININE: Creatinine, Ser: 1.1 mg/dL (ref 0.61–1.24)

## 2021-10-28 MED ORDER — IOHEXOL 300 MG/ML  SOLN
75.0000 mL | Freq: Once | INTRAMUSCULAR | Status: AC | PRN
  Administered 2021-10-28: 75 mL via INTRAVENOUS

## 2021-10-28 MED ORDER — SODIUM CHLORIDE (PF) 0.9 % IJ SOLN
INTRAMUSCULAR | Status: AC
  Filled 2021-10-28: qty 50

## 2021-10-28 NOTE — Telephone Encounter (Signed)
Pt was here for his CT scan and his wife was upset that he had no lab appt this morning before his CT scan. She states she spoke with someone last week and they were supposed to move his 10/23/21 lab appt to "next week" (which is now this week). ? ?I reviewed pts appt scheduling and found that Pt spoke with a lab staff member on 10/23/21 and his lab appt was moved to 10/26/21 but pt was a no show. She states they have always had all of his appts on the same date because of travel. I advised that pts appt was moved from 10/23/21 to 10/26/21. ? ?I have scheduled the pt for a lab appt today before he sees Dr. Julien Nordmann. Bother pt and his wife understood this information. ?

## 2021-10-28 NOTE — Progress Notes (Signed)
?    Bill [name] ?[phone]   [phone] ? ?OFFICE PROGRESS NOTE ? ?Bill Bott, MD ?[address] ?[place] [address] ? ?DIAGNOSIS: Stage IIb (T3, N0, MX) non-small cell lung cancer, adenocarcinoma with no actionable mutations and negative PD-L1 expression. ? ?PRIOR THERAPY:   ?1) status post right lower lobectomy with lymph node dissection under the care of Dr. Roxan Hockey on April 01, 2021. ?2) systemic chemotherapy with cisplatin 75 Mg/M2 and Alimta 500 Mg/M2 every 3 weeks.  First cycle May 28, 2021.  Status post 4 cycles.  Last dose was given July 30, 2021. ? ?CURRENT THERAPY: Observation. ? ?INTERVAL HISTORY: ?Bill [name] 61 y.o. male returns to the clinic today for follow-up visit accompanied by his wife.  The patient is feeling fine today with no concerning complaints except for intermittent right-sided chest pain with mild cough and no shortness of breath or hemoptysis.  He denied having any nausea, vomiting, diarrhea or constipation.  He has no headache or visual changes.  He has no recent weight loss or night sweats.  He had repeat CT scan of the chest performed recently and he is here for evaluation and discussion of his discuss results. ? ?MEDICAL HISTORY: ?Past Medical History:  ?Diagnosis Date  ? Arthritis   ? Diabetes mellitus ([place])   ? History of kidney stones   ? Hyperlipidemia   ? Hypertension   ? Lung mass   ? Tobacco abuse   ? ? ?ALLERGIES:  is allergic to [employer] dimeglumine] and penicillins. ? ?MEDICATIONS:  ?Current Outpatient Medications  ?Medication Sig Dispense Refill  ? albuterol (VENTOLIN HFA) 108 (90 Base) MCG/ACT inhaler Inhale 2 puffs into the lungs every 6 (six) hours as needed for wheezing or shortness of breath. 8 g 2  ? atorvastatin (LIPITOR) 40 MG tablet Take 40 mg by mouth daily.    ? dexamethasone (DECADRON) 4 MG tablet 1 tablet p.o. twice daily the day before, day of and day after the  chemotherapy (Patient not taking: Reported on 09/03/2021) 30 tablet 0  ? DULoxetine (CYMBALTA) 30 MG capsule Take 30 mg by mouth daily.    ? folic acid (FOLVITE) 1 MG tablet Take 1 tablet (1 mg total) by mouth daily. (Patient not taking: Reported on 09/03/2021) 30 tablet 4  ? gabapentin (NEURONTIN) 400 MG capsule Take 400 mg by mouth 2 (two) times daily.    ? guaiFENesin (MUCINEX) 600 MG 12 hr tablet Take 1 tablet (600 mg total) by mouth 2 (two) times daily. Take if needed for cough or congestion.    ? lisinopril (ZESTRIL) 10 MG tablet Take 10 mg by mouth daily.    ? Melatonin 5 MG CAPS Take 5 mg by mouth at bedtime as needed (sleep).    ? meloxicam (MOBIC) 15 MG tablet Take 1 tablet by mouth daily.    ? metFORMIN (GLUCOPHAGE-XR) 500 MG 24 hr tablet Take 500 mg by mouth in the morning, at noon, and at bedtime.    ? methadone (DOLOPHINE) 10 MG tablet Take 20 mg by mouth in the morning, at noon, and at bedtime.    ? prochlorperazine (COMPAZINE) 10 MG tablet Take 1 tablet (10 mg total) by mouth every 6 (six) hours as needed for nausea or vomiting. (Patient not taking: Reported on 09/03/2021) 30 tablet 0  ? tadalafil (CIALIS) 5 MG tablet Take 1 tablet by mouth daily as needed for erectile dysfunction.    ? testosterone cypionate (  DEPOTESTOSTERONE CYPIONATE) 200 MG/ML injection Inject 1 mL into the muscle every 14 (fourteen) days.    ? traMADol (ULTRAM) 50 MG tablet Take 1-2 tablets by mouth every 6 (six) hours as needed.    ? ?No current facility-administered medications for this visit.  ? ?Facility-Administered Medications Ordered in Other Visits  ?Medication Dose Route Frequency Provider Last Rate Last Admin  ? sodium chloride (PF) 0.9 % injection           ? ? ?SURGICAL HISTORY:  ?Past Surgical History:  ?Procedure Laterality Date  ? ANKLE SURGERY    ? BACK SURGERY    ? INTERCOSTAL NERVE BLOCK Right 04/01/2021  ? Procedure: INTERCOSTAL NERVE BLOCK;  Surgeon: [name], MD;  Location: [facility];  Service: Thoracic;   Laterality: Right;  ? KNEE CARTILAGE SURGERY    ? LYMPH NODE DISSECTION Right 04/01/2021  ? Procedure: LYMPH NODE DISSECTION;  Surgeon: [name], MD;  Location: [facility];  Service: Thoracic;  Laterality: Right;  ? ? ?REVIEW OF SYSTEMS:  A comprehensive review of systems was negative except for: Respiratory: positive for cough and pleurisy/chest pain  ? ?PHYSICAL EXAMINATION: General appearance: alert, cooperative, and no distress ?Head: Normocephalic, without obvious abnormality, atraumatic ?Neck: no adenopathy, no JVD, supple, symmetrical, trachea midline, and thyroid not enlarged, symmetric, no tenderness/mass/nodules ?Lymph nodes: Cervical, supraclavicular, and axillary nodes normal. ?Resp: clear to auscultation bilaterally ?Back: symmetric, no curvature. ROM normal. No CVA tenderness. ?Cardio: regular rate and rhythm, S1, S2 normal, no murmur, click, rub or gallop ?GI: soft, non-tender; bowel sounds normal; no masses,  no organomegaly ?Extremities: extremities normal, atraumatic, no cyanosis or edema ? ?ECOG PERFORMANCE STATUS: 1 - Symptomatic but completely ambulatory ? ?Blood pressure 134/74, pulse 98, temperature 98.1 ?F (36.7 ?C), temperature source Tympanic, resp. rate 18, weight 174 lb 8 oz (79.2 kg), SpO2 100 %. ? ?LABORATORY DATA: ?Lab Results  ?Component Value Date  ? WBC 8.0 10/28/2021  ? HGB 12.5 (L) 10/28/2021  ? HCT 38.2 (L) 10/28/2021  ? MCV 83.0 10/28/2021  ? PLT 230 10/28/2021  ? ? ?  Chemistry   ?   ?Component Value Date/Time  ? NA 138 08/27/2021 0950  ? K 4.5 08/27/2021 0950  ? CL 103 08/27/2021 0950  ? CO2 31 08/27/2021 0950  ? BUN 14 08/27/2021 0950  ? CREATININE 1.10 10/28/2021 1155  ? CREATININE 0.95 08/27/2021 0950  ?    ?Component Value Date/Time  ? CALCIUM 8.8 (L) 08/27/2021 0950  ? ALKPHOS 89 08/27/2021 0950  ? AST 13 (L) 08/27/2021 0950  ? ALT 9 08/27/2021 0950  ? BILITOT 0.3 08/27/2021 0950  ?  ? ? ? ?RADIOGRAPHIC STUDIES: ?No results found. ? ?ASSESSMENT AND PLAN: This is a  very pleasant 61 years old white male diagnosed with a stage IIb (T3, N0, M0) non-small cell lung cancer, adenocarcinoma with no actionable mutations and negative PD-L1 expression. ?He started adjuvant systemic chemotherapy with cisplatin 75 Mg/M2 and Alimta 500 Mg/M2 every 3 weeks on May 28, 2021.  Status post 4 cycles.  Last dose was given on July 30, 2021. ?The patient is currently on observation and he is feeling fine with no concerning complaints except for intermittent right-sided chest pain and cough. ?He had repeat CT scan of the chest performed recently.  I personally and independently reviewed the scan images and discussed the result with the patient and his wife. ?I do not see any concerning findings for disease recurrence on the imaging studies but  the final report of the scan is still pending. ?I recommended for the patient to continue on observation with repeat CT scan of the chest in 4 months but if there is any concerning findings on the final report I will call the patient with further recommendation. ?The patient and his wife are in agreement with the current plan. ?He was advised to call immediately if he has any other concerning symptoms in the interval. ?The patient voices understanding of current disease status and treatment options and is in agreement with the current care plan. ? ?All questions were answered. The patient knows to call the clinic with any problems, questions or concerns. We can certainly see the patient much sooner if necessary. ? ? ?Disclaimer: This note was dictated with voice recognition software. Similar sounding words can inadvertently be transcribed and may not be corrected upon review. ? ? ?  ?    ?

## 2021-12-18 ENCOUNTER — Encounter: Payer: Self-pay | Admitting: *Deleted

## 2021-12-18 NOTE — Progress Notes (Signed)
Oncology Nurse Navigator Documentation     12/18/2021    3:00 PM 05/15/2021    2:00 PM 05/14/2021   12:00 PM  Oncology Nurse Navigator Flowsheets  Abnormal Finding Date   08/02/2020  Confirmed Diagnosis Date   04/01/2021  Diagnosis Status   Confirmed Diagnosis Complete  Planned Course of Treatment   Surgery;Neo Chemo  Phase of Treatment Chemo  Chemo  Chemotherapy Pending- Reason:   Surgeon or Oncologist Initiated  Chemotherapy Actual Start Date:   05/28/2021  Chemotherapy Actual End Date: 05/29/2021    Surgery Actual Start Date:   04/01/2021  Navigator Follow Up Date:   05/28/2021  Navigator Follow Up Reason:   Follow-up Appointment  Navigation Complete Date: 12/18/2021    Post Navigation: Continue to Follow Patient? No    Reason Not Navigating Patient: No Treatment, Observation Only    Navigator Location [facility]  Referral Date to RadOnc/MedOnc 04/16/2021    Navigator Encounter Type  Telephone Telephone  Telephone  Outgoing Call;Education   Treatment Initiated Date   04/01/2021  Barriers/Navigation Needs  Education Education;Coordination of Care  Education  Other Other  Interventions  Education Coordination of Care;Education;Psycho-Social Support  Acuity  Level 2-Minimal Needs (1-2 Barriers Identified) Level 2-Minimal Needs (1-2 Barriers Identified)  Coordination of Care   Other  Education Method  Verbal Verbal  Time Spent with Patient 15 15 30

## 2022-03-02 ENCOUNTER — Inpatient Hospital Stay: Payer: BC Managed Care – PPO | Attending: Internal Medicine

## 2022-03-02 ENCOUNTER — Ambulatory Visit (HOSPITAL_COMMUNITY)
Admission: RE | Admit: 2022-03-02 | Discharge: 2022-03-02 | Disposition: A | Discharge status: Home / Self Care | Payer: BC Managed Care – PPO | Source: Ambulatory Visit | Attending: Internal Medicine | Admitting: Internal Medicine

## 2022-03-02 ENCOUNTER — Other Ambulatory Visit: Payer: Self-pay

## 2022-03-02 DIAGNOSIS — C3490 Malignant neoplasm of unspecified part of unspecified bronchus or lung: Secondary | ICD-10-CM

## 2022-03-02 LAB — CMP (CANCER CENTER ONLY)
ALT: 15 U/L (ref 0–44)
AST: 14 U/L — ABNORMAL LOW (ref 15–41)
Albumin: 4.3 g/dL (ref 3.5–5.0)
Alkaline Phosphatase: 95 U/L (ref 38–126)
Anion gap: 4 — ABNORMAL LOW (ref 5–15)
BUN: 18 mg/dL (ref 8–23)
CO2: 32 mmol/L (ref 22–32)
Calcium: 9.4 mg/dL (ref 8.9–10.3)
Chloride: 102 mmol/L (ref 98–111)
Creatinine: 1.14 mg/dL (ref 0.61–1.24)
GFR, Estimated: >60 mL/min (ref >60)
Glucose, Bld: 143 mg/dL — ABNORMAL HIGH (ref 70–99)
Potassium: 4.4 mmol/L (ref 3.5–5.1)
Sodium: 138 mmol/L (ref 135–145)
Total Bilirubin: 0.3 mg/dL (ref 0.3–1.2)
Total Protein: 7.1 g/dL (ref 6.5–8.1)

## 2022-03-02 LAB — CBC WITH DIFFERENTIAL (CANCER CENTER ONLY)
Abs Immature Granulocytes: 0.03 10*3/uL (ref 0.00–0.07)
Basophils Absolute: 0 10*3/uL (ref 0.0–0.1)
Basophils Relative: 0 %
Eosinophils Absolute: 0.2 10*3/uL (ref 0.0–0.5)
Eosinophils Relative: 2 %
HCT: 36.9 % — ABNORMAL LOW (ref 39.0–52.0)
Hemoglobin: 12.1 g/dL — ABNORMAL LOW (ref 13.0–17.0)
Immature Granulocytes: 0 %
Lymphocytes Relative: 20 %
Lymphs Abs: 1.6 10*3/uL (ref 0.7–4.0)
MCH: 27.6 pg (ref 26.0–34.0)
MCHC: 32.8 g/dL (ref 30.0–36.0)
MCV: 84.1 fL (ref 80.0–100.0)
Monocytes Absolute: 0.6 10*3/uL (ref 0.1–1.0)
Monocytes Relative: 7 %
Neutro Abs: 5.4 10*3/uL (ref 1.7–7.7)
Neutrophils Relative %: 71 %
Platelet Count: 189 10*3/uL (ref 150–400)
RBC: 4.39 MIL/uL (ref 4.22–5.81)
RDW: 15.3 % (ref 11.5–15.5)
WBC Count: 7.8 10*3/uL (ref 4.0–10.5)
nRBC: 0 % (ref 0.0–0.2)

## 2022-03-02 MED ORDER — IOHEXOL 300 MG/ML  SOLN
75.0000 mL | Freq: Once | INTRAMUSCULAR | Status: AC | PRN
Start: 2022-03-02 — End: 2022-03-02
  Administered 2022-03-02: 75 mL via INTRAVENOUS

## 2022-03-02 MED ORDER — SODIUM CHLORIDE (PF) 0.9 % IJ SOLN
INTRAMUSCULAR | Status: AC
  Filled 2022-03-02: qty 50

## 2022-03-03 ENCOUNTER — Telehealth: Payer: Self-pay

## 2022-03-03 NOTE — Telephone Encounter (Signed)
Pts wife is requesting his follow-up appt to review CT scan results be approved as a telephone visit instead of in-person.

## 2022-03-04 ENCOUNTER — Inpatient Hospital Stay (HOSPITAL_BASED_OUTPATIENT_CLINIC_OR_DEPARTMENT_OTHER): Payer: BC Managed Care – PPO | Admitting: Internal Medicine

## 2022-03-04 ENCOUNTER — Ambulatory Visit: Payer: BC Managed Care – PPO | Admitting: Internal Medicine

## 2022-03-04 DIAGNOSIS — C3490 Malignant neoplasm of unspecified part of unspecified bronchus or lung: Secondary | ICD-10-CM | POA: Diagnosis not present

## 2022-03-04 NOTE — Progress Notes (Signed)
[facility] [phone]   [phone]  PROGRESS NOTE FOR TELEMEDICINE VISITS  Bill Bott, MD [address] [name] [address]  I connected withNAME@ on 03/04/22 at 11:00 AM EDT by video enabled telemedicine visit and verified that I am speaking with the correct person using two identifiers.   I discussed the limitations, risks, security and privacy concerns of performing an evaluation and management service by telemedicine and the availability of in-person appointments. I also discussed with the patient that there may be a patient responsible charge related to this service. The patient expressed understanding and agreed to proceed.  Other persons participating in the visit and their role in the encounter:  None  Patient's location: Home Provider's location: [facility]  DIAGNOSIS: Stage IIB (T3, N0, MX) non-small cell lung cancer, adenocarcinoma with no actionable mutations and negative PD-L1 expression.   PRIOR THERAPY:   1) status post right lower lobectomy with lymph node dissection under the care of Dr. Roxan Hockey on April 01, 2021. 2) systemic chemotherapy with cisplatin 75 Mg/M2 and Alimta 500 Mg/M2 every 3 weeks.  First cycle May 28, 2021.  Status post 4 cycles.  Last dose was given July 30, 2021.   CURRENT THERAPY: Observation.  INTERVAL HISTORY: Bill [name] 61 y.o. male has a MyChart virtual video visit with me today for evaluation and discussion of his scan results.  The patient is feeling fine today with no concerning complaints except for wheezing.  He denied having any chest pain, shortness of breath, cough or hemoptysis.  He has no nausea, vomiting, diarrhea or constipation.  He has no headache or visual changes.  He has no recent weight loss or night sweats.  He had repeat CT scan of the chest performed recently and he has the visit today for evaluation and discussion of his scan  results.  MEDICAL HISTORY: Past Medical History:  Diagnosis Date   Arthritis    Diabetes mellitus ([place])    History of kidney stones    Hyperlipidemia    Hypertension    Lung mass    Tobacco abuse     ALLERGIES:  is allergic to [employer] dimeglumine] and penicillins.  MEDICATIONS:  Current Outpatient Medications  Medication Sig Dispense Refill   albuterol (VENTOLIN HFA) 108 (90 Base) MCG/ACT inhaler Inhale 2 puffs into the lungs every 6 (six) hours as needed for wheezing or shortness of breath. 8 g 2   atorvastatin (LIPITOR) 40 MG tablet Take 40 mg by mouth daily.     dexamethasone (DECADRON) 4 MG tablet 1 tablet p.o. twice daily the day before, day of and day after the chemotherapy (Patient not taking: Reported on 09/03/2021) 30 tablet 0   DULoxetine (CYMBALTA) 30 MG capsule Take 30 mg by mouth daily.     folic acid (FOLVITE) 1 MG tablet Take 1 tablet (1 mg total) by mouth daily. (Patient not taking: Reported on 09/03/2021) 30 tablet 4   gabapentin (NEURONTIN) 400 MG capsule Take 400 mg by mouth 2 (two) times daily.     guaiFENesin (MUCINEX) 600 MG 12 hr tablet Take 1 tablet (600 mg total) by mouth 2 (two) times daily. Take if needed for cough or congestion.     lisinopril (ZESTRIL) 10 MG tablet Take 10 mg by mouth daily.     Melatonin 5 MG CAPS Take 5 mg by mouth at bedtime as needed (sleep).     meloxicam (MOBIC) 15 MG tablet Take  1 tablet by mouth daily.     metFORMIN (GLUCOPHAGE-XR) 500 MG 24 hr tablet Take 500 mg by mouth in the morning, at noon, and at bedtime.     methadone (DOLOPHINE) 10 MG tablet Take 20 mg by mouth in the morning, at noon, and at bedtime.     prochlorperazine (COMPAZINE) 10 MG tablet Take 1 tablet (10 mg total) by mouth every 6 (six) hours as needed for nausea or vomiting. (Patient not taking: Reported on 09/03/2021) 30 tablet 0   tadalafil (CIALIS) 5 MG tablet Take 1 tablet by mouth daily as needed for erectile dysfunction.     testosterone cypionate  (DEPOTESTOSTERONE CYPIONATE) 200 MG/ML injection Inject 1 mL into the muscle every 14 (fourteen) days.     traMADol (ULTRAM) 50 MG tablet Take 1-2 tablets by mouth every 6 (six) hours as needed.     No current facility-administered medications for this visit.    SURGICAL HISTORY:  Past Surgical History:  Procedure Laterality Date   ANKLE SURGERY     BACK SURGERY     INTERCOSTAL NERVE BLOCK Right 04/01/2021   Procedure: INTERCOSTAL NERVE BLOCK;  Surgeon: Hendrickson, Steven C, MD;  Location: [facility];  Service: Thoracic;  Laterality: Right;   KNEE CARTILAGE SURGERY     LYMPH NODE DISSECTION Right 04/01/2021   Procedure: LYMPH NODE DISSECTION;  Surgeon: Hendrickson, Steven C, MD;  Location: [facility];  Service: Thoracic;  Laterality: Right;    REVIEW OF SYSTEMS:  A comprehensive review of systems was negative.    LABORATORY DATA: Lab Results  Component Value Date   WBC 7.8 03/02/2022   HGB 12.1 (L) 03/02/2022   HCT 36.9 (L) 03/02/2022   MCV 84.1 03/02/2022   PLT 189 03/02/2022      Chemistry      Component Value Date/Time   NA 138 03/02/2022 1011   K 4.4 03/02/2022 1011   CL 102 03/02/2022 1011   CO2 32 03/02/2022 1011   BUN 18 03/02/2022 1011   CREATININE 1.14 03/02/2022 1011      Component Value Date/Time   CALCIUM 9.4 03/02/2022 1011   ALKPHOS 95 03/02/2022 1011   AST 14 (L) 03/02/2022 1011   ALT 15 03/02/2022 1011   BILITOT 0.3 03/02/2022 1011       RADIOGRAPHIC STUDIES: CT Chest W Contrast  Result Date: 03/03/2022 CLINICAL DATA:  Lung cancer.  * Tracking Code: BO * EXAM: CT CHEST WITH CONTRAST TECHNIQUE: Multidetector CT imaging of the chest was performed during intravenous contrast administration. RADIATION DOSE REDUCTION: This exam was performed according to the departmental dose-optimization program which includes automated exposure control, adjustment of the mA and/or kV according to patient size and/or use of iterative reconstruction technique. CONTRAST:  75mL  OMNIPAQUE IOHEXOL 300 MG/ML  SOLN COMPARISON:  10/28/2021. FINDINGS: Cardiovascular: Atherosclerotic calcification of the aorta, aortic valve and coronary arteries. Heart is enlarged. No pericardial effusion. Mediastinum/Nodes: No pathologically enlarged mediastinal, hilar or axillary lymph nodes. Esophagus is grossly unremarkable. Lungs/Pleura: Right lower lobectomy. Scattered mild scarring in the right hemithorax. A few scattered pulmonary nodules measure up to 6 mm in the right middle lobe. Recommend continued attention on follow-up. Small thick-walled right pleural effusion with associated pleurodesis, unchanged. Small adjacent extrapleural lymph nodes medially. Lungs are otherwise clear. Airway is otherwise unremarkable. Upper Abdomen: Visualized portions of the liver, gallbladder and adrenal glands are unremarkable. There may be a tiny stone in the right kidney (2/161). Visualized portions of the kidneys, spleen, pancreas, stomach and   bowel are otherwise grossly unremarkable. Upper abdominal lymph nodes measure up to 13 mm in the portacaval station, unchanged. Musculoskeletal: Degenerative changes in the spine. No worrisome lytic or sclerotic lesions. IMPRESSION: 1. Right lower lobectomy. Chronic right fibrothorax. No evidence of recurrent or metastatic disease. 2. Possible tiny stone in the right kidney. 3. Aortic atherosclerosis (ICD10-I70.0). Coronary artery calcification. Electronically Signed   By: Lorin Picket M.D.   On: 03/03/2022 11:27    ASSESSMENT AND PLAN:  This is a very pleasant 61 years old white male diagnosed with a stage IIb (T3, N0, M0) non-small cell lung cancer, adenocarcinoma with no actionable mutations and negative PD-L1 expression. He started adjuvant systemic chemotherapy with cisplatin 75 Mg/M2 and Alimta 500 Mg/M2 every 3 weeks on May 28, 2021.  Status post 4 cycles.  Last dose was given on July 30, 2021. The patient is currently on observation and he is feeling fine  with no concerning complaints except for chest wheezing. He had repeat CT scan of the chest performed recently.  I personally and independently reviewed the scan and discussed the result with the patient today. His scan showed no concerning findings for disease recurrence or metastasis. I recommended for him to continue on observation with repeat CT scan of the chest in 6 months. For the small kidney stone, I encouraged the patient to increase his hydration and if he has any pain to reach out to his primary care physician or urologist. He was advised to call immediately if he has any other concerning symptoms in the interval. I discussed the assessment and treatment plan with the patient. The patient was provided an opportunity to ask questions and all were answered. The patient agreed with the plan and demonstrated an understanding of the instructions.   The patient was advised to call back or seek an in-person evaluation if the symptoms worsen or if the condition fails to improve as anticipated.  I provided 15 minutes of face-to-face video visit time during this encounter, and > 50% was spent counseling as documented under my assessment & plan.  Eilleen Kempf, MD 03/04/2022 11:22 AM  Disclaimer: This note was dictated with voice recognition software. Similar sounding words can inadvertently be transcribed and may not be corrected upon review.

## 2022-03-05 DIAGNOSIS — Z720 Tobacco use: Secondary | ICD-10-CM | POA: Diagnosis not present

## 2022-03-05 DIAGNOSIS — I700 Atherosclerosis of aorta: Secondary | ICD-10-CM | POA: Diagnosis not present

## 2022-03-05 DIAGNOSIS — L219 Seborrheic dermatitis, unspecified: Secondary | ICD-10-CM | POA: Diagnosis not present

## 2022-03-05 DIAGNOSIS — R7989 Other specified abnormal findings of blood chemistry: Secondary | ICD-10-CM | POA: Diagnosis not present

## 2022-03-05 DIAGNOSIS — E785 Hyperlipidemia, unspecified: Secondary | ICD-10-CM | POA: Diagnosis not present

## 2022-03-05 DIAGNOSIS — E1169 Type 2 diabetes mellitus with other specified complication: Secondary | ICD-10-CM | POA: Diagnosis not present

## 2022-03-05 DIAGNOSIS — C3491 Malignant neoplasm of unspecified part of right bronchus or lung: Secondary | ICD-10-CM | POA: Diagnosis not present

## 2022-03-05 DIAGNOSIS — M5136 Other intervertebral disc degeneration, lumbar region: Secondary | ICD-10-CM | POA: Diagnosis not present

## 2022-03-05 DIAGNOSIS — E1159 Type 2 diabetes mellitus with other circulatory complications: Secondary | ICD-10-CM | POA: Diagnosis not present

## 2022-03-05 DIAGNOSIS — D649 Anemia, unspecified: Secondary | ICD-10-CM | POA: Diagnosis not present

## 2022-06-29 DIAGNOSIS — E1169 Type 2 diabetes mellitus with other specified complication: Secondary | ICD-10-CM | POA: Diagnosis not present

## 2022-06-29 DIAGNOSIS — E785 Hyperlipidemia, unspecified: Secondary | ICD-10-CM | POA: Diagnosis not present

## 2022-06-29 DIAGNOSIS — G8928 Other chronic postprocedural pain: Secondary | ICD-10-CM | POA: Diagnosis not present

## 2022-06-29 DIAGNOSIS — Z720 Tobacco use: Secondary | ICD-10-CM | POA: Diagnosis not present

## 2022-08-21 IMAGING — CT CT CHEST W/ CM
2 of 6 series · 14 of 36 positions shown, 17 images · IV contrast (agent unspecified)
Comparison: Chest CT 11/17/2020.

CLINICAL DATA: 61-year-old male with history of non-small cell lung
cancer. Status post right lower lobectomy. Chemotherapy complete.
Right-sided chest pain and shortness of breath with exertion.
Staging examination.

* onc *
EXAM:
CT CHEST WITH CONTRAST
TECHNIQUE: Multidetector CT imaging of the chest was performed during
intravenous contrast administration.

[Series 2: axial st · axial · 0.77mm/px · z∈[-319,-45]mm · 11 of 163 slices shown, 14 images]
[im 13/163  mediastinal]
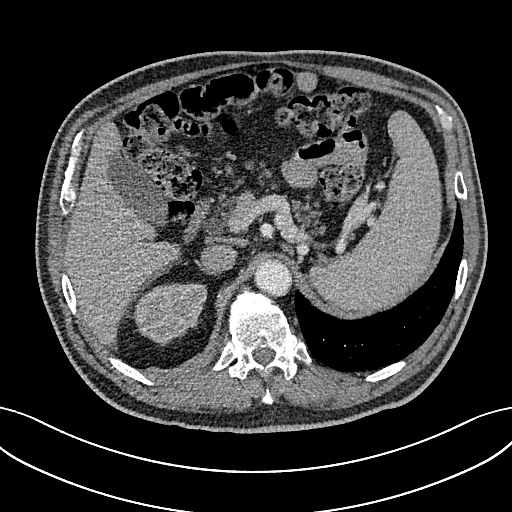
[im 13/163  lung]
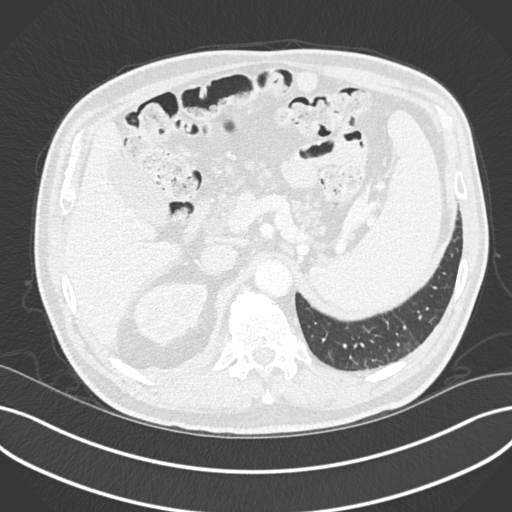
[im 25/163  lung]
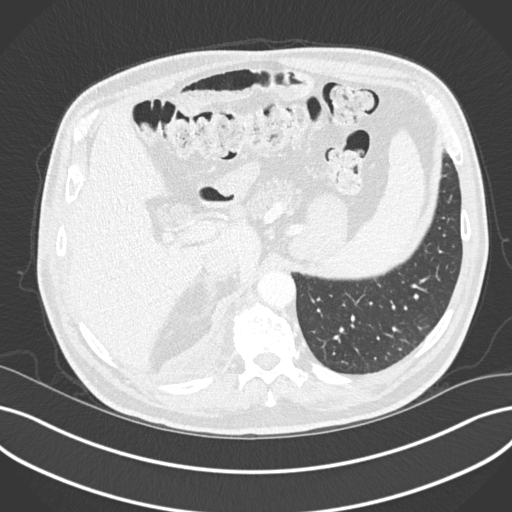
[im 38/163  lung]
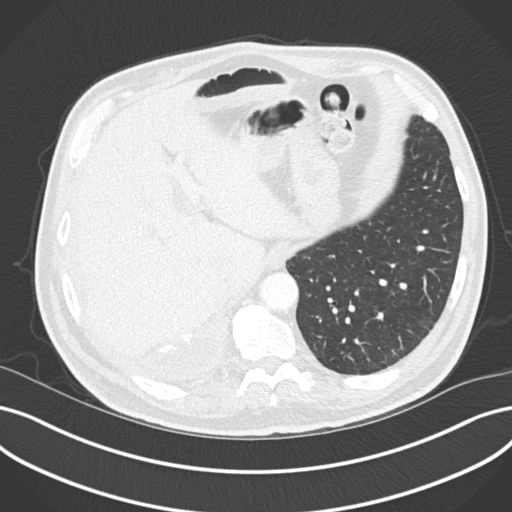
[im 50/163  lung]
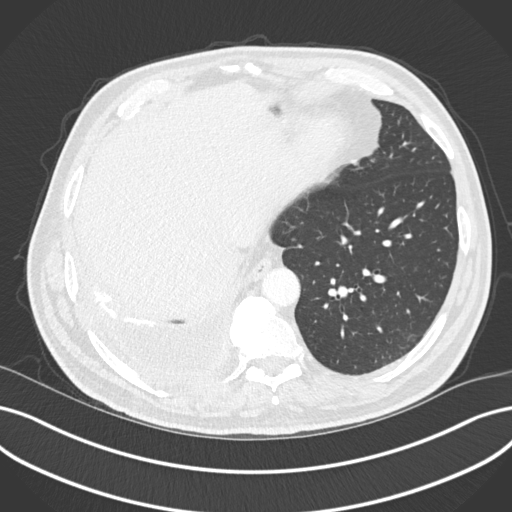
[im 63/163  mediastinal]
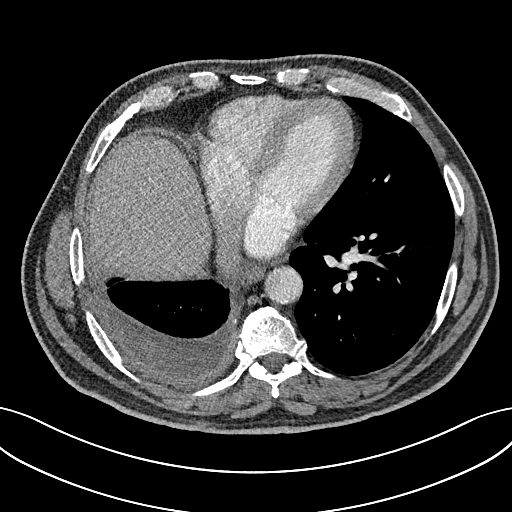
[im 63/163  lung]
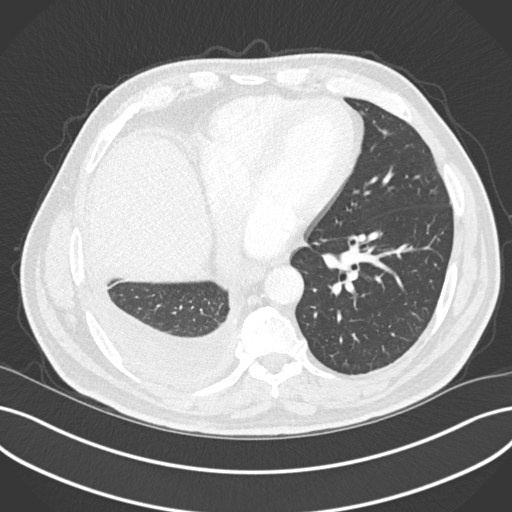
[im 88/163  lung]
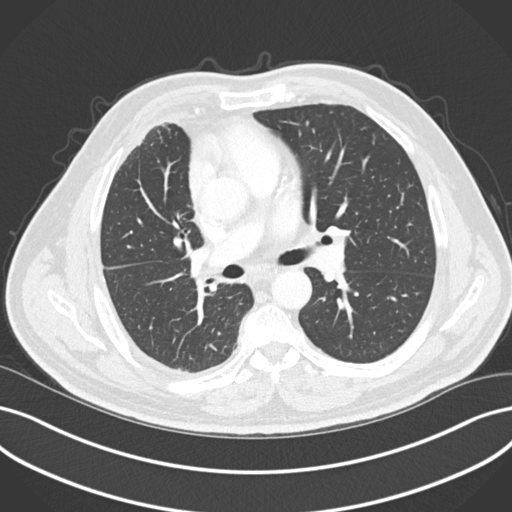
[im 100/163  lung]
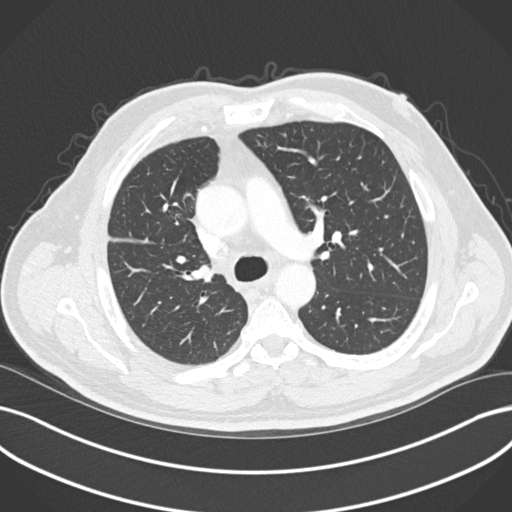
[im 113/163  lung]
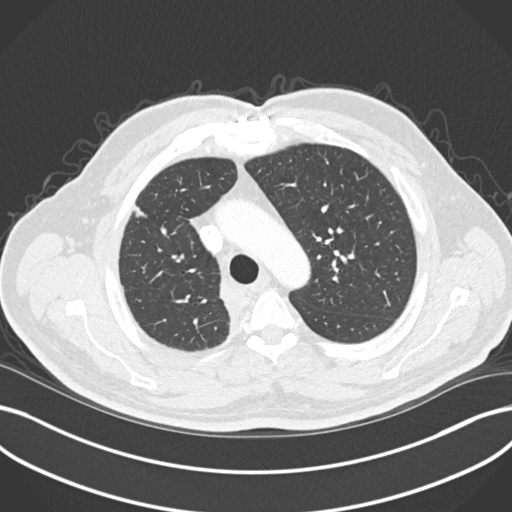
[im 125/163  mediastinal]
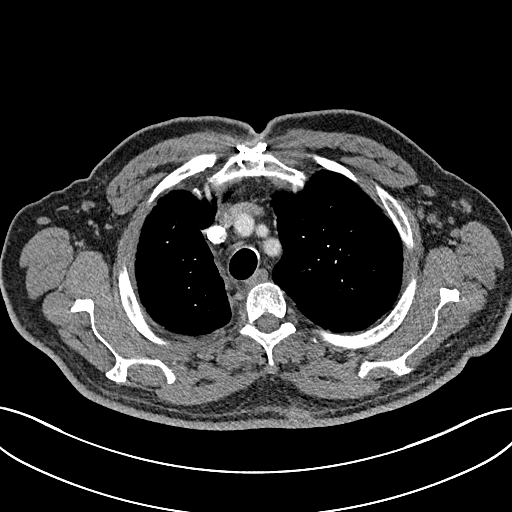
[im 125/163  lung]
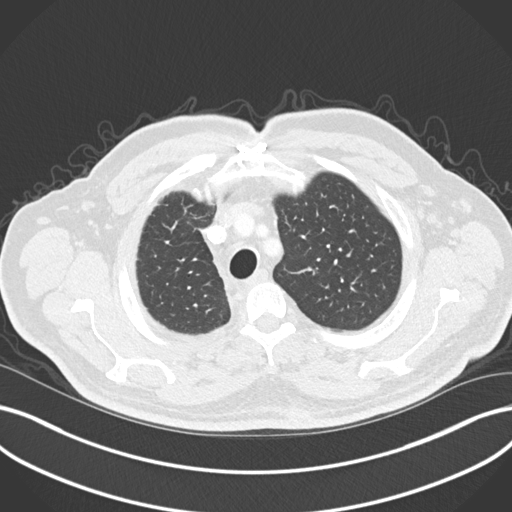
[im 138/163  lung]
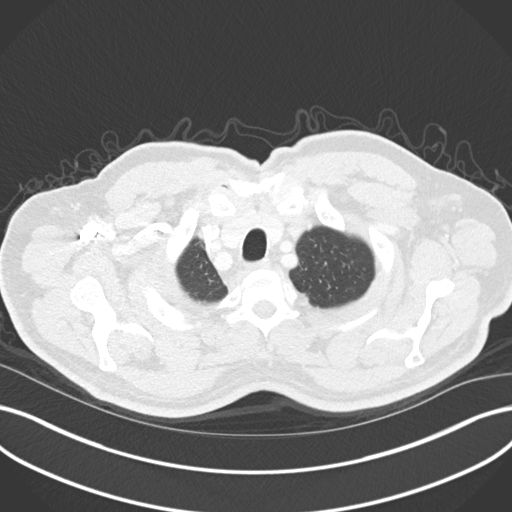
[im 150/163  lung]
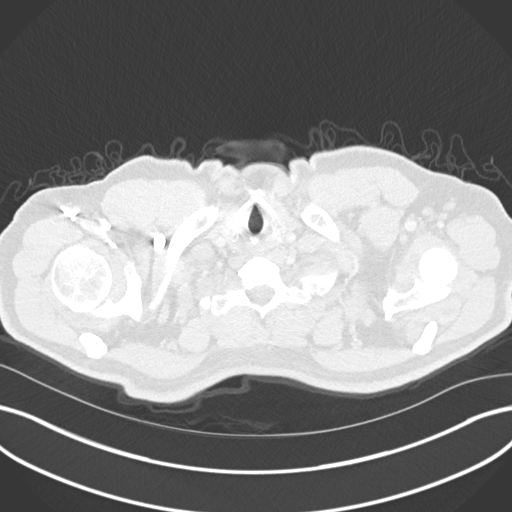

[Series 10: coronal · coronal · 0.70mm/px · 3 of 149 slices shown]
[im 30/149  lung]
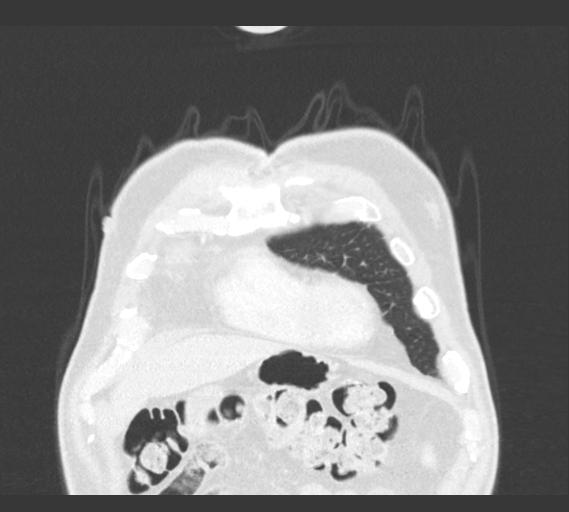
[im 60/149  lung]
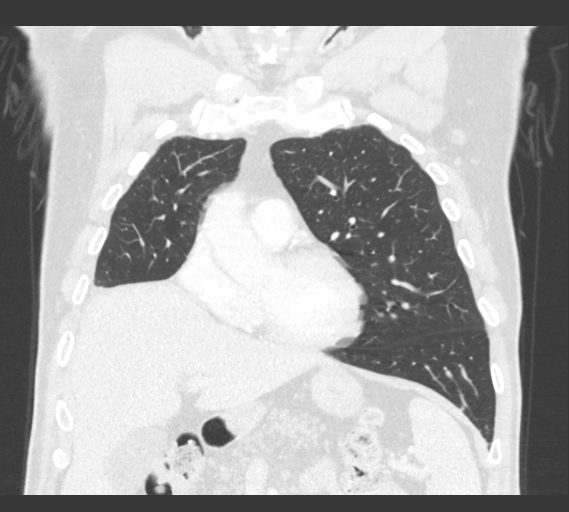
[im 89/149  lung]
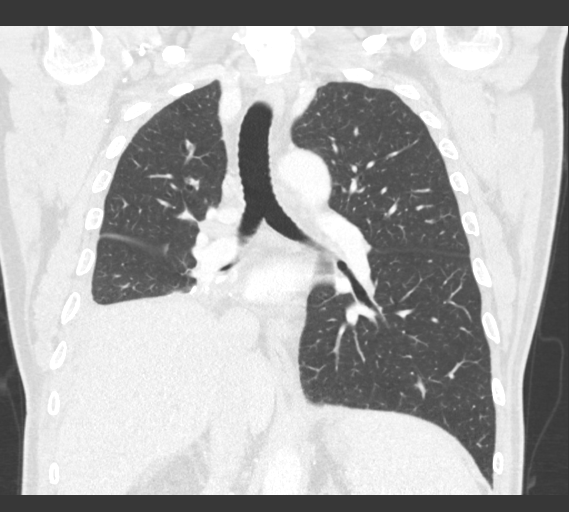

[14 of 36 positions shown; findings below may reference images not displayed]

RADIATION DOSE REDUCTION: This exam was performed according to the
departmental dose-optimization program which includes automated
exposure control, adjustment of the mA and/or kV according to
patient size and/or use of iterative reconstruction technique.

CONTRAST:  75mL OMNIPAQUE IOHEXOL 300 MG/ML  SOLN
FINDINGS: Cardiovascular: Heart size is normal. There is no significant
pericardial fluid, thickening or pericardial calcification. There is
aortic atherosclerosis, as well as atherosclerosis of the great
vessels of the mediastinum and the coronary arteries, including
calcified atherosclerotic plaque in the left main, left anterior
descending, left circumflex and right coronary arteries.

Mediastinum/Nodes: Mediastinal or no pathologically enlarged hilar
lymph nodes. Esophagus is unremarkable in appearance. No axillary
lymphadenopathy.

Lungs/Pleura: Postoperative changes of right lower lobectomy are
noted, with compensatory hyperexpansion of the right upper and
middle lobes. No suspicious appearing pulmonary nodules or masses
are noted. No acute consolidative airspace disease. Small to
moderate volume of right-sided pleural fluid with some pleural
thickening and high density material along the diaphragmatic pleural
surface in the inferior right hemithorax, which may represent suture
material and/or dystrophic calcifications. This pleural thickening
is most evident on axial images 112 and 113 of series 2 where some
of this soft tissue appears to extend into the posteromedial aspect
of the right chest wall, with a rather mass-like appearance,
estimated to measure approximately 7.5 x 2.1 cm at this level (axial
image 113 of series 2). Other faintly calcified pleural plaques are
also noted posteriorly in the right hemithorax.

Upper Abdomen: Unremarkable.

Musculoskeletal: Plate and screw fixation hardware in the manubrium
and upper sternum are incidentally noted. There are no aggressive
appearing lytic or blastic lesions noted in the visualized portions
of the skeleton.
IMPRESSION: 1. Status post right lower lobectomy. There is a chronic
postoperative fluid collection in the right pleural space with
extensive right-sided pleural thickening, some of which is rather
mass-like in appearance, with potential invasion of the right chest
wall. This could simply represent postoperative scarring, as this is
the first postoperative study we have for evaluation. However, the
possibility of pleural metastasis is difficult to entirely exclude.
If there is clinical concern for recurrent disease, further
evaluation with PET-CT should be considered at this time.
2. Aortic atherosclerosis, in addition to left main and three-vessel
coronary artery disease. Please note that although the presence of
coronary artery calcium documents the presence of coronary artery
disease, the severity of this disease and any potential stenosis
cannot be assessed on this non-gated CT examination. Assessment for
potential risk factor modification, dietary therapy or pharmacologic
therapy may be warranted, if clinically indicated.

Aortic Atherosclerosis ([id number]).

## 2022-08-31 ENCOUNTER — Other Ambulatory Visit: Payer: BC Managed Care – PPO

## 2022-09-02 ENCOUNTER — Ambulatory Visit: Payer: BC Managed Care – PPO | Admitting: Internal Medicine

## 2022-09-02 NOTE — Progress Notes (Deleted)
[facility] OFFICE PROGRESS NOTE  [name], MD [address] [place] [address]  DIAGNOSIS: Stage IIb (T3, N0, MX) non-small cell lung cancer, adenocarcinoma with no actionable mutations and negative PD-L1 expression.   PRIOR THERAPY: 1) status post right lower lobectomy with lymph node dissection under the care of Dr. Roxan Hockey on April 01, 2021. 2) systemic chemotherapy with cisplatin 75 Mg/M2 and Alimta 500 Mg/M2 every 3 weeks.  First cycle May 28, 2021.  Status post 4 cycles.  Last dose was given July 30, 2021.  CURRENT THERAPY: Observation   INTERVAL HISTORY: Bill [name] 62 y.o. male returns to the clinic today for a follow-up visit accompanied by his wife. The patient is status post a right lower lobectomy in October 2022.  He underwent 4 cycles of adjuvant chemotherapy which she completed on 07/30/2021.  He has been on observation since that time and feeling well. Today, he denies any recent fever, chills, night sweats, or unexplained weight loss. He reports stable dyspnea on exertion. Reports some post surgical discomfort which is the same. Denies any cough or hemoptysis.  Denies any more recent nausea, vomiting, diarrhea, or constipation.  Denies any headache or visual changes.  The patient recently had a restaging CT scan performed.  The patient is here today for evaluations and to review his scan results.   MEDICAL HISTORY: Past Medical History:  Diagnosis Date   Arthritis    Diabetes mellitus ([place])    History of kidney stones    Hyperlipidemia    Hypertension    Lung mass    Tobacco abuse     ALLERGIES:  is allergic to [employer] dimeglumine] and penicillins.  MEDICATIONS:  Current Outpatient Medications  Medication Sig Dispense Refill   albuterol (VENTOLIN HFA) 108 (90 Base) MCG/ACT inhaler Inhale 2 puffs into the lungs every 6 (six) hours as needed for wheezing or shortness of breath. 8 g 2    atorvastatin (LIPITOR) 40 MG tablet Take 40 mg by mouth daily.     dexamethasone (DECADRON) 4 MG tablet 1 tablet p.o. twice daily the day before, day of and day after the chemotherapy (Patient not taking: Reported on 09/03/2021) 30 tablet 0   DULoxetine (CYMBALTA) 30 MG capsule Take 30 mg by mouth daily.     folic acid (FOLVITE) 1 MG tablet Take 1 tablet (1 mg total) by mouth daily. (Patient not taking: Reported on 09/03/2021) 30 tablet 4   gabapentin (NEURONTIN) 400 MG capsule Take 400 mg by mouth 2 (two) times daily.     guaiFENesin (MUCINEX) 600 MG 12 hr tablet Take 1 tablet (600 mg total) by mouth 2 (two) times daily. Take if needed for cough or congestion.     lisinopril (ZESTRIL) 10 MG tablet Take 10 mg by mouth daily.     Melatonin 5 MG CAPS Take 5 mg by mouth at bedtime as needed (sleep).     meloxicam (MOBIC) 15 MG tablet Take 1 tablet by mouth daily.     metFORMIN (GLUCOPHAGE-XR) 500 MG 24 hr tablet Take 500 mg by mouth in the morning, at noon, and at bedtime.     methadone (DOLOPHINE) 10 MG tablet Take 20 mg by mouth in the morning, at noon, and at bedtime.     prochlorperazine (COMPAZINE) 10 MG tablet Take 1 tablet (10 mg total) by mouth every 6 (six) hours as needed for nausea or vomiting. (Patient not taking: Reported on 09/03/2021) 30 tablet 0  tadalafil (CIALIS) 5 MG tablet Take 1 tablet by mouth daily as needed for erectile dysfunction.     testosterone cypionate (DEPOTESTOSTERONE CYPIONATE) 200 MG/ML injection Inject 1 mL into the muscle every 14 (fourteen) days.     traMADol (ULTRAM) 50 MG tablet Take 1-2 tablets by mouth every 6 (six) hours as needed.     No current facility-administered medications for this visit.    SURGICAL HISTORY:  Past Surgical History:  Procedure Laterality Date   ANKLE SURGERY     BACK SURGERY     INTERCOSTAL NERVE BLOCK Right 04/01/2021   Procedure: INTERCOSTAL NERVE BLOCK;  Surgeon: [name], MD;  Location: [place];  Service: Thoracic;   Laterality: Right;   KNEE CARTILAGE SURGERY     LYMPH NODE DISSECTION Right 04/01/2021   Procedure: LYMPH NODE DISSECTION;  Surgeon: [name], MD;  Location: [place];  Service: Thoracic;  Laterality: Right;    REVIEW OF SYSTEMS:   Review of Systems  Constitutional: Negative for appetite change, chills, fatigue, fever and unexpected weight change.  HENT:   Negative for mouth sores, nosebleeds, sore throat and trouble swallowing.   Eyes: Negative for eye problems and icterus.  Respiratory: Negative for cough, hemoptysis, shortness of breath and wheezing.   Cardiovascular: Negative for chest pain and leg swelling.  Gastrointestinal: Negative for abdominal pain, constipation, diarrhea, nausea and vomiting.  Genitourinary: Negative for bladder incontinence, difficulty urinating, dysuria, frequency and hematuria.   Musculoskeletal: Negative for back pain, gait problem, neck pain and neck stiffness.  Skin: Negative for itching and rash.  Neurological: Negative for dizziness, extremity weakness, gait problem, headaches, light-headedness and seizures.  Hematological: Negative for adenopathy. Does not bruise/bleed easily.  Psychiatric/Behavioral: Negative for confusion, depression and sleep disturbance. The patient is not nervous/anxious.     PHYSICAL EXAMINATION:  There were no vitals taken for this visit.  ECOG PERFORMANCE STATUS: {CHL ONC ECOG [id number]  Physical Exam  Constitutional: Oriented to person, place, and time and well-developed, well-nourished, and in no distress. No distress.  HENT:  Head: Normocephalic and atraumatic.  Mouth/Throat: Oropharynx is clear and moist. No oropharyngeal exudate.  Eyes: Conjunctivae are normal. Right eye exhibits no discharge. Left eye exhibits no discharge. No scleral icterus.  Neck: Normal range of motion. Neck supple.  Cardiovascular: Normal rate, regular rhythm, normal heart sounds and intact distal pulses.   Pulmonary/Chest:  Effort normal and breath sounds normal. No respiratory distress. No wheezes. No rales.  Abdominal: Soft. Bowel sounds are normal. Exhibits no distension and no mass. There is no tenderness.  Musculoskeletal: Normal range of motion. Exhibits no edema.  Lymphadenopathy:    No cervical adenopathy.  Neurological: Alert and oriented to person, place, and time. Exhibits normal muscle tone. Gait normal. Coordination normal.  Skin: Skin is warm and dry. No rash noted. Not diaphoretic. No erythema. No pallor.  Psychiatric: Mood, memory and judgment normal.  Vitals reviewed.  LABORATORY DATA: Lab Results  Component Value Date   WBC 7.8 03/02/2022   HGB 12.1 (L) 03/02/2022   HCT 36.9 (L) 03/02/2022   MCV 84.1 03/02/2022   PLT 189 03/02/2022      Chemistry      Component Value Date/Time   NA 138 03/02/2022 1011   K 4.4 03/02/2022 1011   CL 102 03/02/2022 1011   CO2 32 03/02/2022 1011   BUN 18 03/02/2022 1011   CREATININE 1.14 03/02/2022 1011      Component Value Date/Time   CALCIUM 9.4 03/02/2022  1011   ALKPHOS 95 03/02/2022 1011   AST 14 (L) 03/02/2022 1011   ALT 15 03/02/2022 1011   BILITOT 0.3 03/02/2022 1011       RADIOGRAPHIC STUDIES:  No results found.   ASSESSMENT/PLAN:  This is a very pleasant 62 year old Caucasian male diagnosed with stage IIb (T3, N0, M0) non-small cell lung cancer, adenocarcinoma.  He is negative for any actionable mutations.  He has negative PD-L1 expression.   The patient is status post right lower lobectomy and lymph node dissection and October 2022 under the care of Dr. Roxan Hockey.   He then completed 4 cycles of adjuvant chemotherapy with cisplatin 75 mg per metered squared and Alimta 500 mg per metered squared IV every 3 weeks.  Last dose on 07/30/2021.    The patient recently had a restaging CT scan performed. Dr. Julien Nordmann personally and independently reviewed the scan and discussed results with the patient today. The scan showed ***  F/U 6  months ***  The patient was advised to call immediately if he has any concerning symptoms in the interval. The patient voices understanding of current disease status and treatment options and is in agreement with the current care plan. All questions were answered. The patient knows to call the clinic with any problems, questions or concerns. We can certainly see the patient much sooner if necessary  No orders of the defined types were placed in this encounter.    I spent {CHL ONC TIME VISIT - [id number] counseling the patient face to face. The total time spent in the appointment was {CHL ONC TIME VISIT - [id number].  Sheldon Amara L Joane Postel, PA-C 09/02/22

## 2022-09-06 ENCOUNTER — Inpatient Hospital Stay (HOSPITAL_BASED_OUTPATIENT_CLINIC_OR_DEPARTMENT_OTHER): Payer: BC Managed Care – PPO | Admitting: Physician Assistant

## 2022-09-06 ENCOUNTER — Other Ambulatory Visit: Payer: Self-pay

## 2022-09-06 ENCOUNTER — Encounter (HOSPITAL_COMMUNITY): Payer: Self-pay

## 2022-09-06 ENCOUNTER — Ambulatory Visit (HOSPITAL_COMMUNITY)
Admission: RE | Admit: 2022-09-06 | Discharge: 2022-09-06 | Disposition: A | Discharge status: Home / Self Care | Payer: BC Managed Care – PPO | Source: Ambulatory Visit | Attending: Internal Medicine | Admitting: Internal Medicine

## 2022-09-06 ENCOUNTER — Inpatient Hospital Stay: Payer: BC Managed Care – PPO | Admitting: Physician Assistant

## 2022-09-06 ENCOUNTER — Inpatient Hospital Stay: Payer: BC Managed Care – PPO | Attending: Physician Assistant

## 2022-09-06 VITALS — BP 95/64 | HR 93 | Temp 97.7°F | Resp 17 | Wt 186.6 lb

## 2022-09-06 DIAGNOSIS — C3431 Malignant neoplasm of lower lobe, right bronchus or lung: Secondary | ICD-10-CM | POA: Diagnosis not present

## 2022-09-06 DIAGNOSIS — R0609 Other forms of dyspnea: Secondary | ICD-10-CM | POA: Insufficient documentation

## 2022-09-06 DIAGNOSIS — Z79899 Other long term (current) drug therapy: Secondary | ICD-10-CM | POA: Diagnosis not present

## 2022-09-06 DIAGNOSIS — Z902 Acquired absence of lung [part of]: Secondary | ICD-10-CM | POA: Insufficient documentation

## 2022-09-06 DIAGNOSIS — J90 Pleural effusion, not elsewhere classified: Secondary | ICD-10-CM | POA: Diagnosis not present

## 2022-09-06 DIAGNOSIS — C3490 Malignant neoplasm of unspecified part of unspecified bronchus or lung: Secondary | ICD-10-CM | POA: Diagnosis not present

## 2022-09-06 DIAGNOSIS — C3491 Malignant neoplasm of unspecified part of right bronchus or lung: Secondary | ICD-10-CM

## 2022-09-06 DIAGNOSIS — I700 Atherosclerosis of aorta: Secondary | ICD-10-CM | POA: Diagnosis not present

## 2022-09-06 LAB — CBC WITH DIFFERENTIAL (CANCER CENTER ONLY)
Abs Immature Granulocytes: 0.03 10*3/uL (ref 0.00–0.07)
Basophils Absolute: 0.1 10*3/uL (ref 0.0–0.1)
Basophils Relative: 1 %
Eosinophils Absolute: 0.3 10*3/uL (ref 0.0–0.5)
Eosinophils Relative: 3 %
HCT: 43.7 % (ref 39.0–52.0)
Hemoglobin: 14.3 g/dL (ref 13.0–17.0)
Immature Granulocytes: 0 %
Lymphocytes Relative: 25 %
Lymphs Abs: 2.5 10*3/uL (ref 0.7–4.0)
MCH: 27.6 pg (ref 26.0–34.0)
MCHC: 32.7 g/dL (ref 30.0–36.0)
MCV: 84.2 fL (ref 80.0–100.0)
Monocytes Absolute: 0.9 10*3/uL (ref 0.1–1.0)
Monocytes Relative: 8 %
Neutro Abs: 6.3 10*3/uL (ref 1.7–7.7)
Neutrophils Relative %: 63 %
Platelet Count: 228 10*3/uL (ref 150–400)
RBC: 5.19 MIL/uL (ref 4.22–5.81)
RDW: 14.5 % (ref 11.5–15.5)
WBC Count: 10.1 10*3/uL (ref 4.0–10.5)
nRBC: 0 % (ref 0.0–0.2)

## 2022-09-06 LAB — CMP (CANCER CENTER ONLY)
ALT: 21 U/L (ref 0–44)
AST: 17 U/L (ref 15–41)
Albumin: 4.4 g/dL (ref 3.5–5.0)
Alkaline Phosphatase: 93 U/L (ref 38–126)
Anion gap: 5 (ref 5–15)
BUN: 17 mg/dL (ref 8–23)
CO2: 30 mmol/L (ref 22–32)
Calcium: 9.4 mg/dL (ref 8.9–10.3)
Chloride: 102 mmol/L (ref 98–111)
Creatinine: 1.15 mg/dL (ref 0.61–1.24)
GFR, Estimated: >60 mL/min (ref >60)
Glucose, Bld: 125 mg/dL — ABNORMAL HIGH (ref 70–99)
Potassium: 4.3 mmol/L (ref 3.5–5.1)
Sodium: 137 mmol/L (ref 135–145)
Total Bilirubin: 0.4 mg/dL (ref 0.3–1.2)
Total Protein: 7.2 g/dL (ref 6.5–8.1)

## 2022-09-06 MED ORDER — SODIUM CHLORIDE (PF) 0.9 % IJ SOLN
INTRAMUSCULAR | Status: AC
  Filled 2022-09-06: qty 50

## 2022-09-06 MED ORDER — IOHEXOL 300 MG/ML  SOLN
75.0000 mL | Freq: Once | INTRAMUSCULAR | Status: AC | PRN
  Administered 2022-09-06: 75 mL via INTRAVENOUS

## 2022-09-06 NOTE — Progress Notes (Signed)
[facility] OFFICE PROGRESS NOTE  [name], MD [address] [place] [address]  DIAGNOSIS: Stage IIb (T3, N0, MX) non-small cell lung cancer, adenocarcinoma with no actionable mutations and negative PD-L1 expression.   PRIOR THERAPY:  1) status post right lower lobectomy with lymph node dissection under the care of Dr. Roxan Hockey on April 01, 2021. 2) systemic chemotherapy with cisplatin 75 Mg/M2 and Alimta 500 Mg/M2 every 3 weeks.  First cycle May 28, 2021.  Status post 4 cycles.  Last dose was given July 30, 2021.  CURRENT THERAPY: Observation   INTERVAL HISTORY: Bill [name] 62 y.o. male returns to clinic today for a 23-monthfollow-up visit accompanied by his wife.  The patient is feeling fairly well today without any concerning complaints.  He denies any fever, chills, night sweats, or unexplained weight loss.  He states that his weight is getting closer to his baseline weight.  He mentions that sometimes at work there are particles in the air that caused him to wheeze.  He does have an albuterol rescue inhaler if needed.  He reports mild stable dyspnea on exertion without any cough or hemoptysis.  He reports sometimes his chest feels abnormal on the right side from his prior lobectomy.  Denies any nausea, vomiting, diarrhea, or constipation.  Denies any headache or visual changes.  He recently had a restaging CT scan performed.  He is here today for evaluation to review his scan results.   MEDICAL HISTORY: Past Medical History:  Diagnosis Date   Arthritis    [place]    History of kidney stones    Hyperlipidemia    Hypertension    Lung mass    Tobacco abuse     ALLERGIES:  is allergic to [employer] and penicillins.  MEDICATIONS:  Current Outpatient Medications  Medication Sig Dispense Refill   albuterol (VENTOLIN HFA) 108 (90 Base) MCG/ACT inhaler Inhale 2 puffs into the lungs every  6 (six) hours as needed for wheezing or shortness of breath. 8 g 2   atorvastatin (LIPITOR) 40 MG tablet Take 40 mg by mouth daily.     dexamethasone (DECADRON) 4 MG tablet 1 tablet p.o. twice daily the day before, day of and day after the chemotherapy (Patient not taking: Reported on 09/03/2021) 30 tablet 0   DULoxetine (CYMBALTA) 30 MG capsule Take 30 mg by mouth daily.     folic acid (FOLVITE) 1 MG tablet Take 1 tablet (1 mg total) by mouth daily. (Patient not taking: Reported on 09/03/2021) 30 tablet 4   gabapentin (NEURONTIN) 400 MG capsule Take 400 mg by mouth 2 (two) times daily.     guaiFENesin (MUCINEX) 600 MG 12 hr tablet Take 1 tablet (600 mg total) by mouth 2 (two) times daily. Take if needed for cough or congestion.     lisinopril (ZESTRIL) 10 MG tablet Take 10 mg by mouth daily.     Melatonin 5 MG CAPS Take 5 mg by mouth at bedtime as needed (sleep).     meloxicam (MOBIC) 15 MG tablet Take 1 tablet by mouth daily.     metFORMIN (GLUCOPHAGE-XR) 500 MG 24 hr tablet Take 500 mg by mouth in the morning, at noon, and at bedtime.     methadone (DOLOPHINE) 10 MG tablet Take 20 mg by mouth in the morning, at noon, and at bedtime.     prochlorperazine (COMPAZINE) 10 MG tablet Take 1 tablet (10 mg total) by  mouth every 6 (six) hours as needed for nausea or vomiting. (Patient not taking: Reported on 09/03/2021) 30 tablet 0   tadalafil (CIALIS) 5 MG tablet Take 1 tablet by mouth daily as needed for erectile dysfunction.     testosterone cypionate (DEPOTESTOSTERONE CYPIONATE) 200 MG/ML injection Inject 1 mL into the muscle every 14 (fourteen) days.     traMADol (ULTRAM) 50 MG tablet Take 1-2 tablets by mouth every 6 (six) hours as needed.     No current facility-administered medications for this visit.   Facility-Administered Medications Ordered in Other Visits  Medication Dose Route Frequency Provider Last Rate Last Admin   sodium chloride (PF) 0.9 % injection             SURGICAL HISTORY:  Past  Surgical History:  Procedure Laterality Date   ANKLE SURGERY     BACK SURGERY     INTERCOSTAL NERVE BLOCK Right 04/01/2021   Procedure: INTERCOSTAL NERVE BLOCK;  Surgeon: [name], MD;  Location: [place];  Service: Thoracic;  Laterality: Right;   KNEE CARTILAGE SURGERY     LYMPH NODE DISSECTION Right 04/01/2021   Procedure: LYMPH NODE DISSECTION;  Surgeon: [name], MD;  Location: [place];  Service: Thoracic;  Laterality: Right;    REVIEW OF SYSTEMS:   Review of Systems  Constitutional: Negative for appetite change, chills, fatigue, fever and unexpected weight change.  HENT: Negative for mouth sores, nosebleeds, sore throat and trouble swallowing.   Eyes: Negative for eye problems and icterus.  Respiratory: Positive for occasional dyspnea exertion.  Positive for occasional wheezing.  Negative for cough or hemoptysis.   Cardiovascular: Negative for chest pain and leg swelling.  Gastrointestinal: Negative for abdominal pain, constipation, diarrhea, nausea and vomiting.  Genitourinary: Negative for bladder incontinence, difficulty urinating, dysuria, frequency and hematuria.   Musculoskeletal: Negative for back pain, gait problem, neck pain and neck stiffness.  Skin: Negative for itching and rash.  Neurological: Negative for dizziness, extremity weakness, gait problem, headaches, light-headedness and seizures.  Hematological: Negative for adenopathy. Does not bruise/bleed easily.  Psychiatric/Behavioral: Negative for confusion, depression and sleep disturbance. The patient is not nervous/anxious.     PHYSICAL EXAMINATION:  Blood pressure 95/64, pulse 93, temperature 97.7 F (36.5 C), temperature source Oral, resp. rate 17, weight 186 lb 9.6 oz (84.6 kg), SpO2 99 %.  ECOG PERFORMANCE STATUS: 1  Physical Exam  Constitutional: Oriented to person, place, and time and well-developed, well-nourished, and in no distress.  HENT:  Head: Normocephalic and atraumatic.   Mouth/Throat: Oropharynx is clear and moist. No oropharyngeal exudate.  Eyes: Conjunctivae are normal. Right eye exhibits no discharge. Left eye exhibits no discharge. No scleral icterus.  Neck: Normal range of motion. Neck supple.  Cardiovascular: Normal rate, regular rhythm, normal heart sounds and intact distal pulses.   Pulmonary/Chest: Effort normal and breath sounds normal. No respiratory distress. No wheezes. No rales.  Abdominal: Soft. Bowel sounds are normal. Exhibits no distension and no mass. There is no tenderness.  Musculoskeletal: Normal range of motion. Exhibits no edema.  Lymphadenopathy:    No cervical adenopathy.  Neurological: Alert and oriented to person, place, and time. Exhibits normal muscle tone. Gait normal. Coordination normal.  Skin: Skin is warm and dry. No rash noted. Not diaphoretic. No erythema. No pallor.  Psychiatric: Mood, memory and judgment normal.  Vitals reviewed.  LABORATORY DATA: Lab Results  Component Value Date   WBC 10.1 09/06/2022   HGB 14.3 09/06/2022   HCT 43.7 09/06/2022  MCV 84.2 09/06/2022   PLT 228 09/06/2022      Chemistry      Component Value Date/Time   NA 137 09/06/2022 0957   K 4.3 09/06/2022 0957   CL 102 09/06/2022 0957   CO2 30 09/06/2022 0957   BUN 17 09/06/2022 0957   CREATININE 1.15 09/06/2022 0957      Component Value Date/Time   CALCIUM 9.4 09/06/2022 0957   ALKPHOS 93 09/06/2022 0957   AST 17 09/06/2022 0957   ALT 21 09/06/2022 0957   BILITOT 0.4 09/06/2022 0957       RADIOGRAPHIC STUDIES:  CT Chest W Contrast  Result Date: 09/06/2022 CLINICAL DATA:  Non-small cell lung cancer restaging * Tracking Code: BO * EXAM: CT CHEST WITH CONTRAST TECHNIQUE: Multidetector CT imaging of the chest was performed during intravenous contrast administration. RADIATION DOSE REDUCTION: This exam was performed according to the departmental dose-optimization program which includes automated exposure control, adjustment of  the mA and/or kV according to patient size and/or use of iterative reconstruction technique. CONTRAST:  80m OMNIPAQUE IOHEXOL 300 MG/ML  SOLN COMPARISON:  03/02/2022 FINDINGS: Cardiovascular: Aortic atherosclerosis. Normal heart size. Three-vessel coronary artery calcifications. No pericardial effusion. Mediastinum/Nodes: No enlarged mediastinal, hilar, or axillary lymph nodes. Thyroid gland, trachea, and esophagus demonstrate no significant findings. Lungs/Pleura: Unchanged postoperative appearance of the chest status post right lower lobectomy. 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 Upper Abdomen: No acute abnormality. Musculoskeletal: No chest wall abnormality. No acute osseous findings. 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 Plate and screw fixation of the superior manubrium and superior sternal body (series 6, image 95. IMPRESSION: 1. Unchanged postoperative appearance of the chest status post right lower lobectomy. 2. No evidence of recurrent or metastatic disease in the chest. 3. 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 4. Coronary artery disease. Aortic Atherosclerosis (ICD10-I70.0). Electronically Signed   By: ADelanna AhmadiM.D.   On: 09/06/2022 15:31     ASSESSMENT/PLAN:  This is a very pleasant 62year old Caucasian male diagnosed with stage IIb (T3, N0, M0) non-small cell lung cancer, adenocarcinoma.  The patient does not have any actionable mutations and has negative PD-L1 expression.  He underwent 4 cycles of adjuvant systemic chemotherapy with cisplatin 75 mg/m and Alimta 500 mg/m IV every 3 weeks.  His last dose was on 07/30/2021.  The patient recently had a restaging CT scan performed.  Dr. MJulien Nordmannpersonally and independently reviewed the scan and  discussed the results with the patient today.  The scan showed unchanged postoperative appearance of the right lower lobectomy.  There is no evidence of recurrent or metastatic disease.  There is a chronic loculated right pleural effusion with associated pleural thickening.  Dr. MJulien Nordmannrecommended continuing observation with a repeat CT scan of the chest in 6 months.  We will see him back for follow-up visit at that time to review his scan results.  The patient lives over an hour away and he prefers to have his CT scan and follow-up visit on the same day or to do a telephone visit a few days later if possible.  Advised the patient to wear a mask at work if there are particles causing wheezing.  He also was advised to use his rescue inhaler if needed.  The patient was advised to call immediately if he has any concerning symptoms  in the interval. The patient voices understanding of current disease status and treatment options and is in agreement with the current care plan. All questions were answered. The patient knows to call the clinic with any problems, questions or concerns. We can certainly see the patient much sooner if necessary   Orders Placed This Encounter  Procedures   CT Chest W Contrast    Standing Status:   Future    Standing Expiration Date:   09/06/2023    Order Specific Question:   If indicated for the ordered procedure, I authorize the administration of contrast media per Radiology protocol    Answer:   Yes    Order Specific Question:   Does the patient have a contrast media/X-ray dye allergy?    Answer:   No    Order Specific Question:   Preferred imaging location?    Answer:   [facility]   CBC with Differential ([place] Only)    Standing Status:   Future    Standing Expiration Date:   09/06/2023   CMP ([place] only)    Standing Status:   Future    Standing Expiration Date:   09/06/2023     Tobe Sos Yovan Leeman,  PA-C 09/06/22  ADDENDUM: Hematology/Oncology Attending:  I had a face-to-face encounter with the patient today.  I reviewed his record, lab, scan and recommended his care plan.  This is a very pleasant 62 years old white male with a stage IIb (T3, N0, M0) non-small cell lung cancer, adenocarcinoma with no actionable mutations and negative PD-L1 expression.  He is status post right lower lobectomy with lymph node dissection followed by 4 cycles of adjuvant systemic chemotherapy with cisplatin and Alimta completed on July 30, 2021. The patient has been on observation since that time and he is feeling fine with no concerning complaints except for intermittent cough and occasional pain on the right side of the chest from the surgical scar. He had repeat CT scan of the chest performed recently.  I personally and independently reviewed the scan and discussed the result with the patient today.  His scan showed no concerning findings for disease recurrence or metastasis. I recommended for him to continue on observation with repeat CT scan of the chest in 6 months. The patient was advised to call immediately if he has any other concerning symptoms in the interval. Disclaimer: This note was dictated with voice recognition software. Similar sounding words can inadvertently be transcribed and may be missed upon review. Eilleen Kempf, MD

## 2022-10-22 IMAGING — CT CT CHEST W/ CM
2 of 4 series · 15 of 36 positions shown, 18 images · IV contrast (agent unspecified)
Comparison: August 27, 2021.

CLINICAL DATA: A 61-year-old male presents for evaluation of lung
cancer, follow-up evaluation.

* Tracking Code: BO *
EXAM:
CT CHEST WITH CONTRAST
TECHNIQUE: Multidetector CT imaging of the chest was performed during
intravenous contrast administration.

[Series 2: axial st · axial · 0.74mm/px · z∈[-432,-124]mm · 12 of 184 slices shown, 15 images]
[im 15/184  mediastinal]
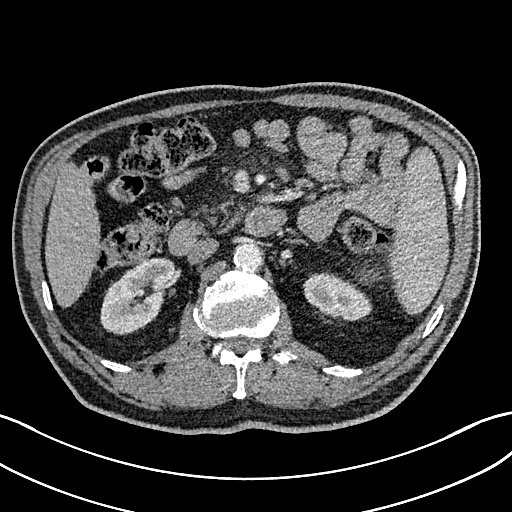
[im 15/184  lung]
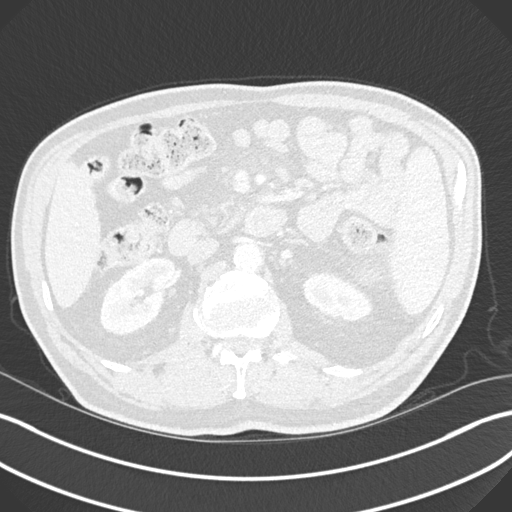
[im 29/184  lung]
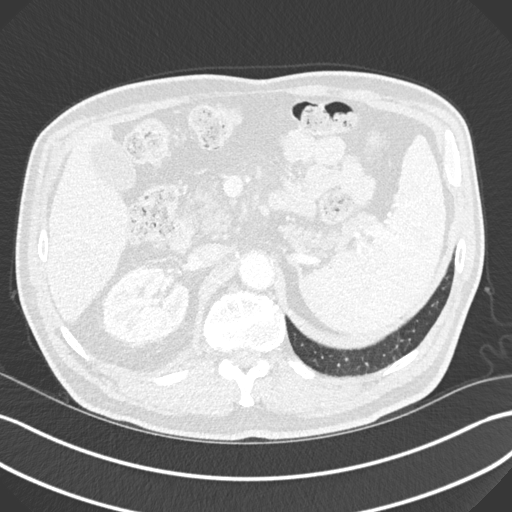
[im 43/184  lung]
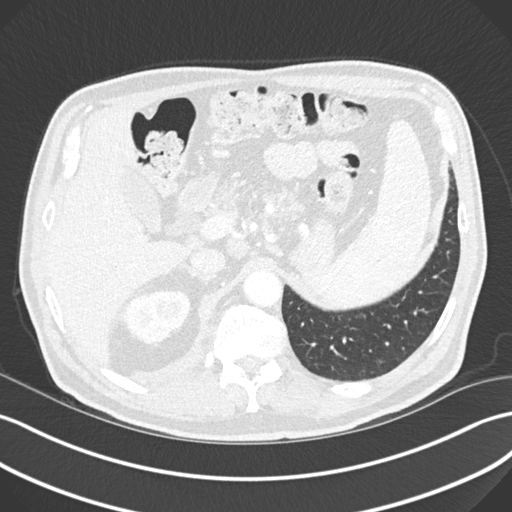
[im 57/184  lung]
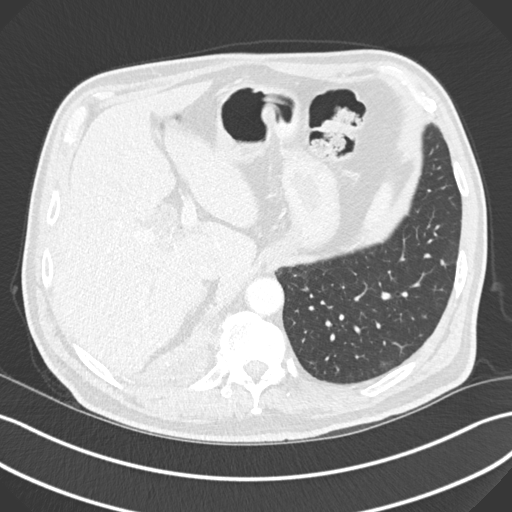
[im 71/184  mediastinal]
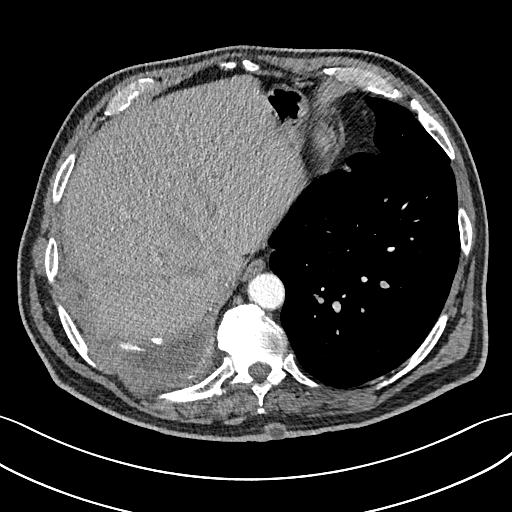
[im 71/184  lung]
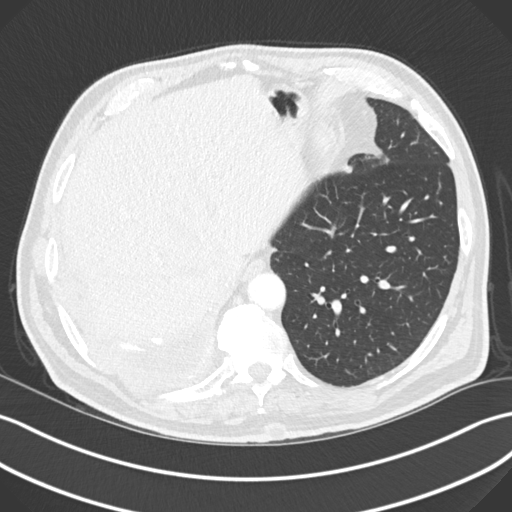
[im 85/184  lung]
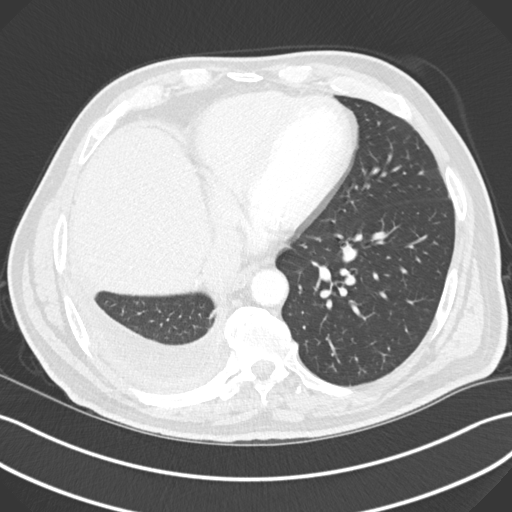
[im 99/184  lung]
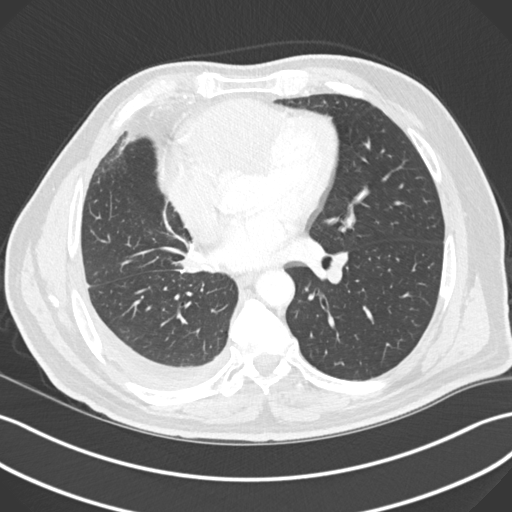
[im 113/184  lung]
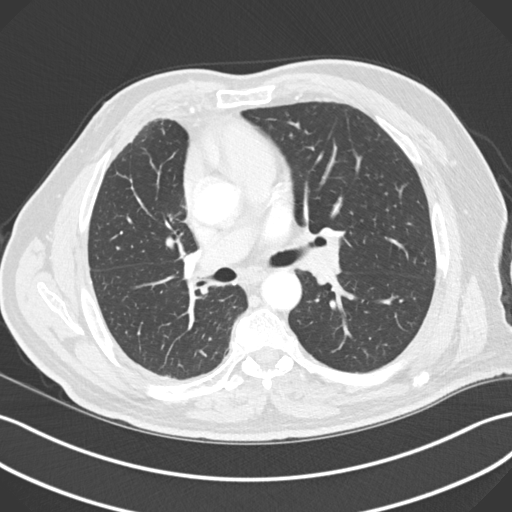
[im 127/184  mediastinal]
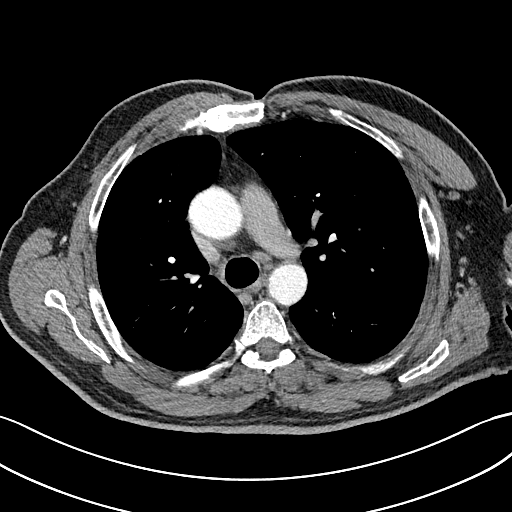
[im 127/184  lung]
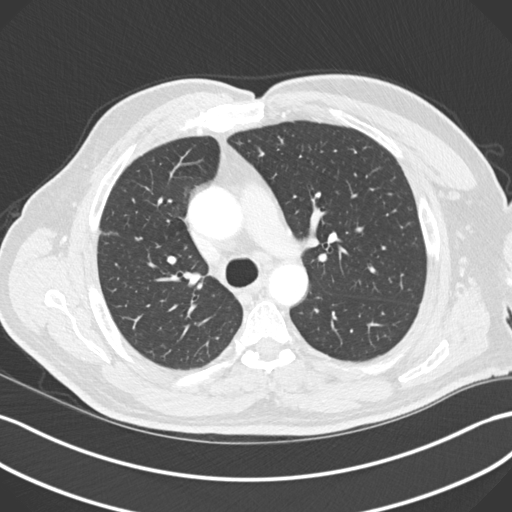
[im 141/184  lung]
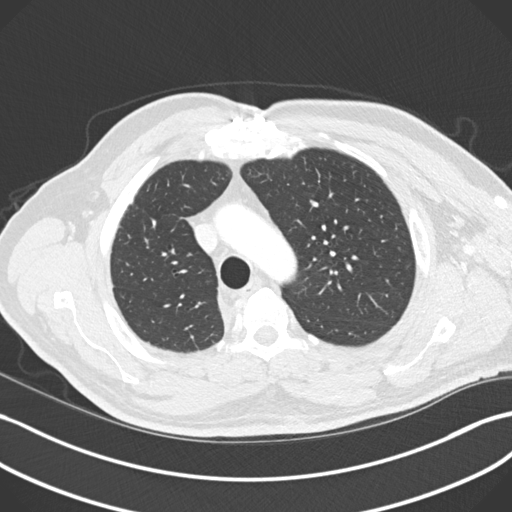
[im 155/184  lung]
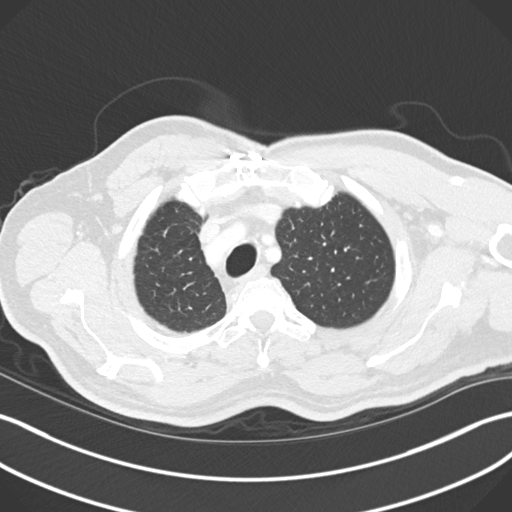
[im 169/184  lung]
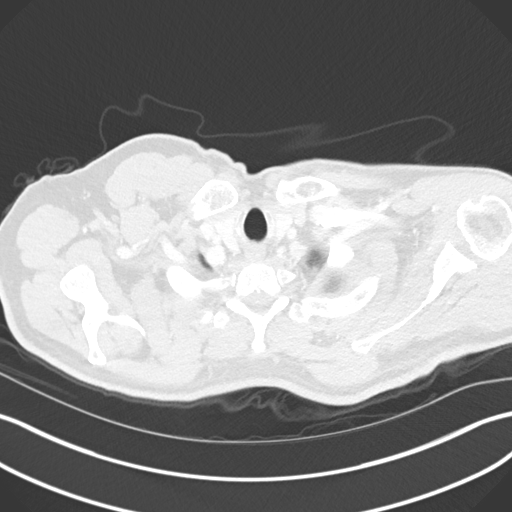

[Series 5: coronal · coronal · 0.74mm/px · 3 of 144 slices shown]
[im 29/144  lung]
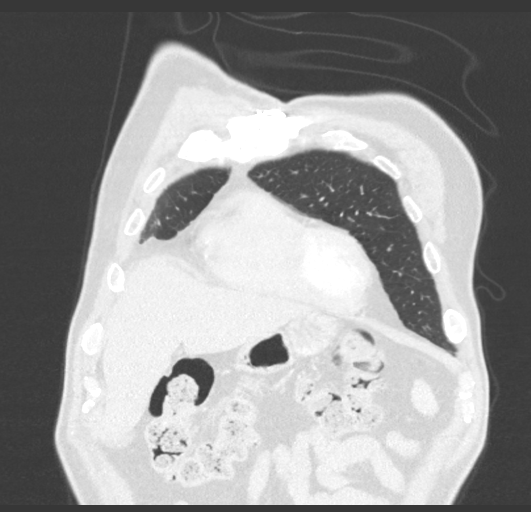
[im 58/144  lung]
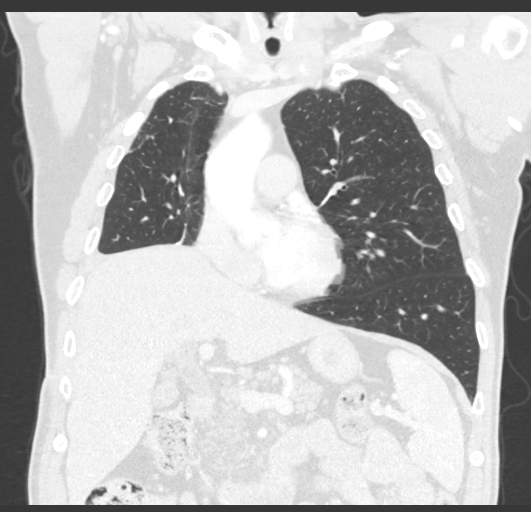
[im 86/144  lung]
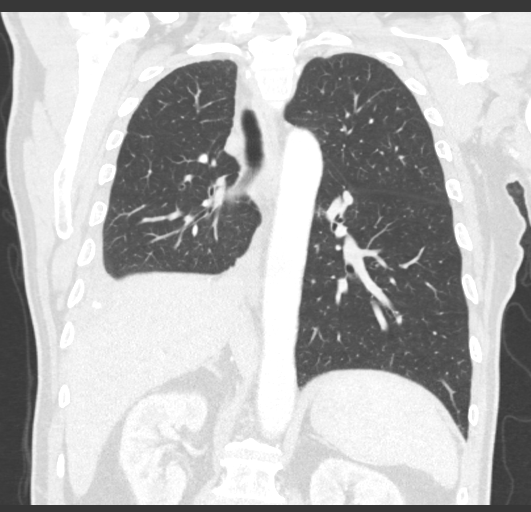

[15 of 36 positions shown; findings below may reference images not displayed]

RADIATION DOSE REDUCTION: This exam was performed according to the
departmental dose-optimization program which includes automated
exposure control, adjustment of the mA and/or kV according to
patient size and/or use of iterative reconstruction technique.

CONTRAST:  75mL OMNIPAQUE IOHEXOL 300 MG/ML  SOLN
FINDINGS: Cardiovascular: Calcified and noncalcified atheromatous plaque in
the thoracic aorta. Heart size is normal. Three-vessel coronary
artery disease. No substantial pericardial effusion or sign of
pericardial nodularity.

Mediastinum/Nodes: No adenopathy in the chest. Esophagus grossly
normal.

Lungs/Pleura: Signs of RIGHT lower lobectomy and chronic appearing
RIGHT-sided pleural effusion.

Extrapleural soft tissue density along the RIGHT posterolateral
chest adjacent to the RIGHT ninth rib (image 106/2) 3.0 x 2.2 cm as
compared to approximately 3.5 x 1.6 cm.

Very small lymph nodes in the extrapleural fat (image 117/2) 9 mm
lymph node in this area along the RIGHT paraspinous region

Increasingly nodular appearance of inferior costodiaphragmatic soft
tissue seen along the posterolateral RIGHT chest (image 113/2)
cm and with loss of a fat plane between the ninth rib and
intercostal neurovascular structures with convex margins in the
costodiaphragmatic sulcus.

Similar appearance of the eighth rib at this level.

Tiny pulmonary nodule (image 96/7) 3 mm. This is unchanged in the
LEFT upper lobe.

Likewise a LEFT lower lobe pulmonary nodule on image 127/4 is also 3
mm and stable.

Upper Abdomen: No acute findings in the upper abdomen. No
adenopathy.

Musculoskeletal: Post sternotomy. Spinal degenerative changes
without acute or destructive bone process.
IMPRESSION: 1. Signs of RIGHT lower lobectomy with decreased size of an area in
the extrapleural fat and musculature along the posterior RIGHT chest
but with increasingly nodular appearance of the margins of pleural
fluid particularly in the posterior costodiaphragmatic sulcus and
with small lymph nodes in the extrapleural fat along the spine.
Suggest PET evaluation for further assessment.
2. Pleural fluid is of similar volume.
3. No signs of adenopathy by size criteria.
4. Stable tiny pulmonary nodules in the LEFT chest.

Aortic Atherosclerosis (DU4ME-DEP.P).

## 2022-12-24 DIAGNOSIS — D649 Anemia, unspecified: Secondary | ICD-10-CM | POA: Diagnosis not present

## 2022-12-24 DIAGNOSIS — E785 Hyperlipidemia, unspecified: Secondary | ICD-10-CM | POA: Diagnosis not present

## 2022-12-24 DIAGNOSIS — E1169 Type 2 diabetes mellitus with other specified complication: Secondary | ICD-10-CM | POA: Diagnosis not present

## 2022-12-24 DIAGNOSIS — G8928 Other chronic postprocedural pain: Secondary | ICD-10-CM | POA: Diagnosis not present

## 2022-12-24 DIAGNOSIS — R7989 Other specified abnormal findings of blood chemistry: Secondary | ICD-10-CM | POA: Diagnosis not present

## 2022-12-24 DIAGNOSIS — E1159 Type 2 diabetes mellitus with other circulatory complications: Secondary | ICD-10-CM | POA: Diagnosis not present

## 2022-12-24 DIAGNOSIS — L219 Seborrheic dermatitis, unspecified: Secondary | ICD-10-CM | POA: Diagnosis not present

## 2022-12-24 DIAGNOSIS — F419 Anxiety disorder, unspecified: Secondary | ICD-10-CM | POA: Diagnosis not present

## 2023-02-14 ENCOUNTER — Telehealth: Payer: Self-pay | Admitting: Medical Oncology

## 2023-02-14 NOTE — Telephone Encounter (Signed)
Wife stated she wants appt for her husband with [facility]. Reason unclear. Pt is on observation  CT chest 9/11 and f/u later.

## 2023-02-14 NOTE — Telephone Encounter (Signed)
Contacted patient/wife to f/u on earlier phone message.  LVM on named VM that it was Dr. Asa Lente office returning call.  Asked her to contact office with further information about need for husband to have appt with Dr. Arbutus Ped at this time.  Provided her with patient's up coming appt dates/times: Lab on 03/09/23 @ 1000; CT on 03/09/23 @ 1100; phone appt w/Dr. Arbutus Ped on 03/14/23 @ 345 to review CT. Requested she or patient contact office at their convenience.

## 2023-03-09 ENCOUNTER — Ambulatory Visit (HOSPITAL_COMMUNITY)
Admission: RE | Admit: 2023-03-09 | Discharge: 2023-03-09 | Disposition: A | Discharge status: Home / Self Care | Payer: BC Managed Care – PPO | Source: Ambulatory Visit | Attending: Physician Assistant | Admitting: Physician Assistant

## 2023-03-09 ENCOUNTER — Inpatient Hospital Stay: Payer: BC Managed Care – PPO | Attending: Internal Medicine

## 2023-03-09 DIAGNOSIS — Z79899 Other long term (current) drug therapy: Secondary | ICD-10-CM | POA: Diagnosis not present

## 2023-03-09 DIAGNOSIS — J90 Pleural effusion, not elsewhere classified: Secondary | ICD-10-CM | POA: Diagnosis not present

## 2023-03-09 DIAGNOSIS — I700 Atherosclerosis of aorta: Secondary | ICD-10-CM | POA: Diagnosis not present

## 2023-03-09 DIAGNOSIS — C3491 Malignant neoplasm of unspecified part of right bronchus or lung: Secondary | ICD-10-CM | POA: Insufficient documentation

## 2023-03-09 DIAGNOSIS — C3431 Malignant neoplasm of lower lobe, right bronchus or lung: Secondary | ICD-10-CM | POA: Insufficient documentation

## 2023-03-09 DIAGNOSIS — C3490 Malignant neoplasm of unspecified part of unspecified bronchus or lung: Secondary | ICD-10-CM | POA: Diagnosis not present

## 2023-03-09 LAB — CMP (CANCER CENTER ONLY)
ALT: 18 U/L (ref 0–44)
AST: 17 U/L (ref 15–41)
Albumin: 4.1 g/dL (ref 3.5–5.0)
Alkaline Phosphatase: 77 U/L (ref 38–126)
Anion gap: 5 (ref 5–15)
BUN: 15 mg/dL (ref 8–23)
CO2: 29 mmol/L (ref 22–32)
Calcium: 9.2 mg/dL (ref 8.9–10.3)
Chloride: 104 mmol/L (ref 98–111)
Creatinine: 1.03 mg/dL (ref 0.61–1.24)
GFR, Estimated: >60 mL/min (ref >60)
Glucose, Bld: 98 mg/dL (ref 70–99)
Potassium: 4.4 mmol/L (ref 3.5–5.1)
Sodium: 138 mmol/L (ref 135–145)
Total Bilirubin: 0.4 mg/dL (ref 0.3–1.2)
Total Protein: 6.8 g/dL (ref 6.5–8.1)

## 2023-03-09 LAB — CBC WITH DIFFERENTIAL (CANCER CENTER ONLY)
Abs Immature Granulocytes: 0.03 10*3/uL (ref 0.00–0.07)
Basophils Absolute: 0 10*3/uL (ref 0.0–0.1)
Basophils Relative: 1 %
Eosinophils Absolute: 0.4 10*3/uL (ref 0.0–0.5)
Eosinophils Relative: 5 %
HCT: 42.7 % (ref 39.0–52.0)
Hemoglobin: 14.3 g/dL (ref 13.0–17.0)
Immature Granulocytes: 0 %
Lymphocytes Relative: 24 %
Lymphs Abs: 1.6 10*3/uL (ref 0.7–4.0)
MCH: 29.3 pg (ref 26.0–34.0)
MCHC: 33.5 g/dL (ref 30.0–36.0)
MCV: 87.5 fL (ref 80.0–100.0)
Monocytes Absolute: 0.6 10*3/uL (ref 0.1–1.0)
Monocytes Relative: 9 %
Neutro Abs: 4.2 10*3/uL (ref 1.7–7.7)
Neutrophils Relative %: 61 %
Platelet Count: 188 10*3/uL (ref 150–400)
RBC: 4.88 MIL/uL (ref 4.22–5.81)
RDW: 14.9 % (ref 11.5–15.5)
WBC Count: 6.8 10*3/uL (ref 4.0–10.5)
nRBC: 0 % (ref 0.0–0.2)

## 2023-03-09 MED ORDER — IOHEXOL 300 MG/ML  SOLN
75.0000 mL | Freq: Once | INTRAMUSCULAR | Status: AC | PRN
  Administered 2023-03-09: 75 mL via INTRAVENOUS

## 2023-03-14 ENCOUNTER — Inpatient Hospital Stay (HOSPITAL_BASED_OUTPATIENT_CLINIC_OR_DEPARTMENT_OTHER): Payer: BC Managed Care – PPO | Admitting: Internal Medicine

## 2023-03-14 DIAGNOSIS — C3490 Malignant neoplasm of unspecified part of unspecified bronchus or lung: Secondary | ICD-10-CM

## 2023-03-14 NOTE — Progress Notes (Signed)
[facility] [phone]   [phone]  PROGRESS NOTE FOR TELEMEDICINE VISITS  Lindwood Qua, MD [address] [place] [address]  I connected withNAME@ on 03/14/23 at  3:45 PM EDT by telephone visit and verified that I am speaking with the correct person using two identifiers.   I discussed the limitations, risks, security and privacy concerns of performing an evaluation and management service by telemedicine and the availability of in-person appointments. I also discussed with the patient that there may be a patient responsible charge related to this service. The patient expressed understanding and agreed to proceed.  Other persons participating in the visit and their role in the encounter:  None  Patient's location: Home Provider's location: [facility]  DIAGNOSIS: Stage IIb (T3, N0, MX) non-small cell lung cancer, adenocarcinoma with no actionable mutations and negative PD-L1 expression.    PRIOR THERAPY:  1) status post right lower lobectomy with lymph node dissection under the care of Dr. Dorris Fetch on April 01, 2021. 2) systemic chemotherapy with cisplatin 75 Mg/M2 and Alimta 500 Mg/M2 every 3 weeks.  First cycle May 28, 2021.  Status post 4 cycles.  Last dose was given July 30, 2021.   CURRENT THERAPY: Observation   INTERVAL HISTORY: Bill [name] 62 y.o. male has a telephone virtual visit with me today for evaluation and discussion of his scan results.  The patient is feeling fine today with no concerning complaints except for chest congestion and postnasal drainage.  He denied having any current chest pain, shortness of breath except with exertion but has mild cough and no hemoptysis.  He has no nausea, vomiting, diarrhea or constipation.  He has no headache or visual changes.  We are having the visit today for evaluation and discussion of his recent CT scan results.  MEDICAL HISTORY: Past  Medical History:  Diagnosis Date   Arthritis    Diabetes mellitus (HCC)    History of kidney stones    Hyperlipidemia    Hypertension    Lung mass    Tobacco abuse     ALLERGIES:  is allergic to [employer] dimeglumine] and penicillins.  MEDICATIONS:  Current Outpatient Medications  Medication Sig Dispense Refill   albuterol (VENTOLIN HFA) 108 (90 Base) MCG/ACT inhaler Inhale 2 puffs into the lungs every 6 (six) hours as needed for wheezing or shortness of breath. 8 g 2   atorvastatin (LIPITOR) 40 MG tablet Take 40 mg by mouth daily.     dexamethasone (DECADRON) 4 MG tablet 1 tablet p.o. twice daily the day before, day of and day after the chemotherapy (Patient not taking: Reported on 09/03/2021) 30 tablet 0   DULoxetine (CYMBALTA) 30 MG capsule Take 30 mg by mouth daily.     folic acid (FOLVITE) 1 MG tablet Take 1 tablet (1 mg total) by mouth daily. (Patient not taking: Reported on 09/03/2021) 30 tablet 4   gabapentin (NEURONTIN) 400 MG capsule Take 400 mg by mouth 2 (two) times daily.     guaiFENesin (MUCINEX) 600 MG 12 hr tablet Take 1 tablet (600 mg total) by mouth 2 (two) times daily. Take if needed for cough or congestion.     lisinopril (ZESTRIL) 10 MG tablet Take 10 mg by mouth daily.     Melatonin 5 MG CAPS Take 5 mg by mouth at bedtime as needed (sleep).     meloxicam (MOBIC) 15 MG tablet Take 1 tablet by mouth daily.  metFORMIN (GLUCOPHAGE-XR) 500 MG 24 hr tablet Take 500 mg by mouth in the morning, at noon, and at bedtime.     methadone (DOLOPHINE) 10 MG tablet Take 20 mg by mouth in the morning, at noon, and at bedtime.     prochlorperazine (COMPAZINE) 10 MG tablet Take 1 tablet (10 mg total) by mouth every 6 (six) hours as needed for nausea or vomiting. (Patient not taking: Reported on 09/03/2021) 30 tablet 0   tadalafil (CIALIS) 5 MG tablet Take 1 tablet by mouth daily as needed for erectile dysfunction.     testosterone cypionate (DEPOTESTOSTERONE CYPIONATE) 200  MG/ML injection Inject 1 mL into the muscle every 14 (fourteen) days.     traMADol (ULTRAM) 50 MG tablet Take 1-2 tablets by mouth every 6 (six) hours as needed.     No current facility-administered medications for this visit.    SURGICAL HISTORY:  Past Surgical History:  Procedure Laterality Date   ANKLE SURGERY     BACK SURGERY     INTERCOSTAL NERVE BLOCK Right 04/01/2021   Procedure: INTERCOSTAL NERVE BLOCK;  Surgeon: Loreli Slot, MD;  Location: [facility] OR;  Service: Thoracic;  Laterality: Right;   KNEE CARTILAGE SURGERY     LYMPH NODE DISSECTION Right 04/01/2021   Procedure: LYMPH NODE DISSECTION;  Surgeon: Loreli Slot, MD;  Location: [facility] OR;  Service: Thoracic;  Laterality: Right;    REVIEW OF SYSTEMS:  A comprehensive review of systems was negative except for: Ears, nose, mouth, throat, and face: positive for nasal congestion Respiratory: positive for cough and dyspnea on exertion     LABORATORY DATA: Lab Results  Component Value Date   WBC 6.8 03/09/2023   HGB 14.3 03/09/2023   HCT 42.7 03/09/2023   MCV 87.5 03/09/2023   PLT 188 03/09/2023      Chemistry      Component Value Date/Time   NA 138 03/09/2023 1028   K 4.4 03/09/2023 1028   CL 104 03/09/2023 1028   CO2 29 03/09/2023 1028   BUN 15 03/09/2023 1028   CREATININE 1.03 03/09/2023 1028      Component Value Date/Time   CALCIUM 9.2 03/09/2023 1028   ALKPHOS 77 03/09/2023 1028   AST 17 03/09/2023 1028   ALT 18 03/09/2023 1028   BILITOT 0.4 03/09/2023 1028       RADIOGRAPHIC STUDIES: No results found.  ASSESSMENT AND PLAN: This is a very pleasant 62 years old white male diagnosed with a stage IIb (T3, N0, M0) non-small cell lung cancer, adenocarcinoma with no actionable mutations and negative PD-L1 expression. He started adjuvant systemic chemotherapy with cisplatin 75 Mg/M2 and Alimta 500 Mg/M2 every 3 weeks on May 28, 2021.  Status post 4 cycles.  Last dose was given on July 30, 2021.  The patient is currently on observation and he is feeling fine with no concerning complaints except for the recent chest congestion and postnasal drainage with mild cough. He had repeat CT scan of the chest performed recently.  Fortunately the final report is still pending but I personally and independently reviewed the scan images and I did not see any clear sign for disease recurrence or metastasis but I will wait for the final report for confirmation. I recommended for the patient to continue on observation with repeat CT scan of the chest but if there is any concerning findings on the pending scan report, I will call him with additional recommendation. He was advised to call immediately if he  has any other concerning symptoms in the interval. I discussed the assessment and treatment plan with the patient. The patient was provided an opportunity to ask questions and all were answered. The patient agreed with the plan and demonstrated an understanding of the instructions.   The patient was advised to call back or seek an in-person evaluation if the symptoms worsen or if the condition fails to improve as anticipated.  I provided 15 minutes of non face-to-face telephone visit time during this encounter, and > 50% was spent counseling as documented under my assessment & plan.  Lajuana Matte, MD 03/14/2023 3:49 PM  Disclaimer: This note was dictated with voice recognition software. Similar sounding words can inadvertently be transcribed and may not be corrected upon review.

## 2023-06-21 DIAGNOSIS — J019 Acute sinusitis, unspecified: Secondary | ICD-10-CM | POA: Diagnosis not present

## 2023-06-21 DIAGNOSIS — I2510 Atherosclerotic heart disease of native coronary artery without angina pectoris: Secondary | ICD-10-CM | POA: Diagnosis not present

## 2023-06-21 DIAGNOSIS — C3491 Malignant neoplasm of unspecified part of right bronchus or lung: Secondary | ICD-10-CM | POA: Diagnosis not present

## 2023-06-21 DIAGNOSIS — G8928 Other chronic postprocedural pain: Secondary | ICD-10-CM | POA: Diagnosis not present

## 2023-06-21 DIAGNOSIS — E1159 Type 2 diabetes mellitus with other circulatory complications: Secondary | ICD-10-CM | POA: Diagnosis not present

## 2023-06-21 DIAGNOSIS — J441 Chronic obstructive pulmonary disease with (acute) exacerbation: Secondary | ICD-10-CM | POA: Diagnosis not present

## 2023-09-05 ENCOUNTER — Other Ambulatory Visit: Payer: BC Managed Care – PPO

## 2023-09-12 ENCOUNTER — Inpatient Hospital Stay: Payer: BC Managed Care – PPO

## 2023-09-12 ENCOUNTER — Other Ambulatory Visit: Payer: Self-pay | Admitting: Internal Medicine

## 2023-09-12 ENCOUNTER — Ambulatory Visit (HOSPITAL_COMMUNITY)
Admission: RE | Admit: 2023-09-12 | Discharge: 2023-09-12 | Disposition: A | Discharge status: Home / Self Care | Payer: BC Managed Care – PPO | Source: Ambulatory Visit | Attending: Internal Medicine | Admitting: Internal Medicine

## 2023-09-12 ENCOUNTER — Inpatient Hospital Stay: Payer: BC Managed Care – PPO | Attending: Internal Medicine | Admitting: Internal Medicine

## 2023-09-12 VITALS — BP 130/87 | HR 84 | Temp 98.1°F | Resp 17 | Ht 70.0 in | Wt 192.6 lb

## 2023-09-12 DIAGNOSIS — R062 Wheezing: Secondary | ICD-10-CM | POA: Insufficient documentation

## 2023-09-12 DIAGNOSIS — Z79899 Other long term (current) drug therapy: Secondary | ICD-10-CM | POA: Insufficient documentation

## 2023-09-12 DIAGNOSIS — C3490 Malignant neoplasm of unspecified part of unspecified bronchus or lung: Secondary | ICD-10-CM | POA: Insufficient documentation

## 2023-09-12 DIAGNOSIS — Z9221 Personal history of antineoplastic chemotherapy: Secondary | ICD-10-CM | POA: Insufficient documentation

## 2023-09-12 DIAGNOSIS — F17290 Nicotine dependence, other tobacco product, uncomplicated: Secondary | ICD-10-CM | POA: Insufficient documentation

## 2023-09-12 DIAGNOSIS — R5383 Other fatigue: Secondary | ICD-10-CM | POA: Insufficient documentation

## 2023-09-12 DIAGNOSIS — C3431 Malignant neoplasm of lower lobe, right bronchus or lung: Secondary | ICD-10-CM | POA: Insufficient documentation

## 2023-09-12 DIAGNOSIS — J90 Pleural effusion, not elsewhere classified: Secondary | ICD-10-CM | POA: Diagnosis not present

## 2023-09-12 DIAGNOSIS — J439 Emphysema, unspecified: Secondary | ICD-10-CM | POA: Diagnosis not present

## 2023-09-12 DIAGNOSIS — R0602 Shortness of breath: Secondary | ICD-10-CM | POA: Insufficient documentation

## 2023-09-12 DIAGNOSIS — R911 Solitary pulmonary nodule: Secondary | ICD-10-CM | POA: Insufficient documentation

## 2023-09-12 DIAGNOSIS — J929 Pleural plaque without asbestos: Secondary | ICD-10-CM | POA: Diagnosis not present

## 2023-09-12 LAB — CBC WITH DIFFERENTIAL (CANCER CENTER ONLY)
Abs Immature Granulocytes: 0.02 10*3/uL (ref 0.00–0.07)
Basophils Absolute: 0.1 10*3/uL (ref 0.0–0.1)
Basophils Relative: 1 %
Eosinophils Absolute: 0.3 10*3/uL (ref 0.0–0.5)
Eosinophils Relative: 4 %
HCT: 48.7 % (ref 39.0–52.0)
Hemoglobin: 15.9 g/dL (ref 13.0–17.0)
Immature Granulocytes: 0 %
Lymphocytes Relative: 27 %
Lymphs Abs: 2.1 10*3/uL (ref 0.7–4.0)
MCH: 27.7 pg (ref 26.0–34.0)
MCHC: 32.6 g/dL (ref 30.0–36.0)
MCV: 85 fL (ref 80.0–100.0)
Monocytes Absolute: 0.7 10*3/uL (ref 0.1–1.0)
Monocytes Relative: 9 %
Neutro Abs: 4.8 10*3/uL (ref 1.7–7.7)
Neutrophils Relative %: 59 %
Platelet Count: 180 10*3/uL (ref 150–400)
RBC: 5.73 MIL/uL (ref 4.22–5.81)
RDW: 13.8 % (ref 11.5–15.5)
WBC Count: 8 10*3/uL (ref 4.0–10.5)
nRBC: 0 % (ref 0.0–0.2)

## 2023-09-12 LAB — CMP (CANCER CENTER ONLY)
ALT: 22 U/L (ref 0–44)
AST: 16 U/L (ref 15–41)
Albumin: 4.1 g/dL (ref 3.5–5.0)
Alkaline Phosphatase: 84 U/L (ref 38–126)
Anion gap: 4 — ABNORMAL LOW (ref 5–15)
BUN: 16 mg/dL (ref 8–23)
CO2: 33 mmol/L — ABNORMAL HIGH (ref 22–32)
Calcium: 9.2 mg/dL (ref 8.9–10.3)
Chloride: 101 mmol/L (ref 98–111)
Creatinine: 1.04 mg/dL (ref 0.61–1.24)
GFR, Estimated: >60 mL/min (ref >60)
Glucose, Bld: 230 mg/dL — ABNORMAL HIGH (ref 70–99)
Potassium: 4.9 mmol/L (ref 3.5–5.1)
Sodium: 138 mmol/L (ref 135–145)
Total Bilirubin: 0.3 mg/dL (ref 0.0–1.2)
Total Protein: 6.7 g/dL (ref 6.5–8.1)

## 2023-09-12 MED ORDER — IOHEXOL 300 MG/ML  SOLN
75.0000 mL | Freq: Once | INTRAMUSCULAR | Status: AC | PRN
  Administered 2023-09-12: 75 mL via INTRAVENOUS

## 2023-09-12 NOTE — Progress Notes (Signed)
 [facility] [phone]   Fax:([phone]  OFFICE PROGRESS NOTE  Lindwood Qua, MD [address] [place] [address]  DIAGNOSIS: Stage IIb (T3, N0, MX) non-small cell lung cancer, adenocarcinoma with no actionable mutations and negative PD-L1 expression.  PRIOR THERAPY:   1) status post right lower lobectomy with lymph node dissection under the care of Dr. Dorris Fetch on April 01, 2021. 2) systemic chemotherapy with cisplatin 75 Mg/M2 and Alimta 500 Mg/M2 every 3 weeks.  First cycle May 28, 2021.  Status post 4 cycles.  Last dose was given July 30, 2021.  CURRENT THERAPY: Observation.  INTERVAL HISTORY: Bill [name] 63 y.o. male returns to the clinic today for follow-up visit accompanied by his [name].Discussed the use of AI scribe software for clinical note transcription with the patient, who gave verbal consent to proceed.  History of Present Illness   Bill [name] is a 63 year old male with adenocarcinoma who presents with labored breathing and fatigue. He is accompanied by Bill [name] who provides additional information about his condition.  He has a history of adenocarcinoma diagnosed in October 2022, for which he underwent a right lower lobectomy. He completed four cycles of chemotherapy with cisplatin and pemetrexed by February 2023. Since September 2024, he has experienced worsening symptoms, including significant fatigue and labored breathing. A recent scan was performed, and while the report is pending, the images show a nodule near the right lung border, consistent with previous findings. He has not reported any new symptoms since the last evaluation.  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 He experiences exhaustion in the evenings, often needing to nap immediately after returning home. His breathing difficulties have been persistent,  and he has started experiencing wheezing. He has not been under the care of a pulmonologist, and his family member expresses concern about his occupational exposure and its impact on his respiratory health. There is a mention of a previous bronchoscopy and a breathing test conducted at a different facility, but details are not fully recalled.       MEDICAL HISTORY: Past Medical History:  Diagnosis Date   Arthritis    Diabetes mellitus (HCC)    History of kidney stones    Hyperlipidemia    Hypertension    Lung mass    Tobacco abuse     ALLERGIES:  is allergic to [employer] dimeglumine] and penicillins.  MEDICATIONS:  Current Outpatient Medications  Medication Sig Dispense Refill   albuterol (VENTOLIN HFA) 108 (90 Base) MCG/ACT inhaler Inhale 2 puffs into the lungs every 6 (six) hours as needed for wheezing or shortness of breath. 8 g 2   atorvastatin (LIPITOR) 40 MG tablet Take 40 mg by mouth daily.     dexamethasone (DECADRON) 4 MG tablet 1 tablet p.o. twice daily the day before, day of and day after the chemotherapy (Patient not taking: Reported on 09/03/2021) 30 tablet 0   DULoxetine (CYMBALTA) 30 MG capsule Take 30 mg by mouth daily.     folic acid (FOLVITE) 1 MG tablet Take 1 tablet (1 mg total) by mouth daily. (Patient not taking: Reported on 09/03/2021) 30 tablet 4   gabapentin (NEURONTIN) 400 MG capsule Take 400 mg by mouth 2 (two) times daily.     guaiFENesin (MUCINEX) 600 MG 12 hr tablet Take 1 tablet (600 mg total) by mouth 2 (two) times daily. Take  if needed for cough or congestion.     lisinopril (ZESTRIL) 10 MG tablet Take 10 mg by mouth daily.     Melatonin 5 MG CAPS Take 5 mg by mouth at bedtime as needed (sleep).     meloxicam (MOBIC) 15 MG tablet Take 1 tablet by mouth daily.     metFORMIN (GLUCOPHAGE-XR) 500 MG 24 hr tablet Take 500 mg by mouth in the morning, at noon, and at bedtime.     methadone (DOLOPHINE) 10 MG tablet Take 20 mg by mouth in the morning, at  noon, and at bedtime.     prochlorperazine (COMPAZINE) 10 MG tablet Take 1 tablet (10 mg total) by mouth every 6 (six) hours as needed for nausea or vomiting. (Patient not taking: Reported on 09/03/2021) 30 tablet 0   tadalafil (CIALIS) 5 MG tablet Take 1 tablet by mouth daily as needed for erectile dysfunction.     testosterone cypionate (DEPOTESTOSTERONE CYPIONATE) 200 MG/ML injection Inject 1 mL into the muscle every 14 (fourteen) days.     traMADol (ULTRAM) 50 MG tablet Take 1-2 tablets by mouth every 6 (six) hours as needed.     No current facility-administered medications for this visit.    SURGICAL HISTORY:  Past Surgical History:  Procedure Laterality Date   ANKLE SURGERY     BACK SURGERY     INTERCOSTAL NERVE BLOCK Right 04/01/2021   Procedure: INTERCOSTAL NERVE BLOCK;  Surgeon: Loreli Slot, MD;  Location: [facility] OR;  Service: Thoracic;  Laterality: Right;   KNEE CARTILAGE SURGERY     LYMPH NODE DISSECTION Right 04/01/2021   Procedure: LYMPH NODE DISSECTION;  Surgeon: Loreli Slot, MD;  Location: [facility] OR;  Service: Thoracic;  Laterality: Right;    REVIEW OF SYSTEMS:  A comprehensive review of systems was negative except for: Constitutional: positive for fatigue Respiratory: positive for cough and dyspnea on exertion   PHYSICAL EXAMINATION: General appearance: alert, cooperative, fatigued, and no distress Head: Normocephalic, without obvious abnormality, atraumatic Neck: no adenopathy, no JVD, supple, symmetrical, trachea midline, and thyroid not enlarged, symmetric, no tenderness/mass/nodules Lymph nodes: Cervical, supraclavicular, and axillary nodes normal. Resp: clear to auscultation bilaterally Back: symmetric, no curvature. ROM normal. No CVA tenderness. Cardio: regular rate and rhythm, S1, S2 normal, no murmur, click, rub or gallop GI: soft, non-tender; bowel sounds normal; no masses,  no organomegaly Extremities: extremities normal, atraumatic, no cyanosis or  edema  ECOG PERFORMANCE STATUS: 1 - Symptomatic but completely ambulatory  Blood pressure 130/87, pulse 84, temperature 98.1 F (36.7 C), temperature source Temporal, resp. rate 17, height 5\' 10"  (1.778 m), weight 192 lb 9.6 oz (87.4 kg), SpO2 99%.  LABORATORY DATA: Lab Results  Component Value Date   WBC 8.0 09/12/2023   HGB 15.9 09/12/2023   HCT 48.7 09/12/2023   MCV 85.0 09/12/2023   PLT 180 09/12/2023      Chemistry      Component Value Date/Time   NA 138 09/12/2023 1021   K 4.9 09/12/2023 1021   CL 101 09/12/2023 1021   CO2 33 (H) 09/12/2023 1021   BUN 16 09/12/2023 1021   CREATININE 1.04 09/12/2023 1021      Component Value Date/Time   CALCIUM 9.2 09/12/2023 1021   ALKPHOS 84 09/12/2023 1021   AST 16 09/12/2023 1021   ALT 22 09/12/2023 1021   BILITOT 0.3 09/12/2023 1021       RADIOGRAPHIC STUDIES: No results found.  ASSESSMENT AND PLAN: This is a very pleasant 62  years old white male diagnosed with a stage IIb (T3, N0, M0) non-small cell lung cancer, adenocarcinoma with no actionable mutations and negative PD-L1 expression. He started adjuvant systemic chemotherapy with cisplatin 75 Mg/M2 and Alimta 500 Mg/M2 every 3 weeks on May 28, 2021.  Status post 4 cycles.  Last dose was given on July 30, 2021. The patient is currently on observation and he is feeling fine with no concerning complaints except for the baseline fatigue and shortness of breath. He had repeat CT scan of the chest performed earlier today but the final pathology is still pending.  I personally independently reviewed the scan images and discussed the result with the patient today.  I do not see any clear evidence for disease progression or recurrence but I will wait for the final report for confirmation.    Adenocarcinoma of the lung Stage 2B adenocarcinoma of the lung, diagnosed in October 2022, treated with lower lobectomy and four cycles of cisplatin and pemetrexed chemotherapy, completed  by February 2023. Recent imaging shows a nodule near the right lung border, unchanged from previous scans. Awaiting final report of recent scan to determine further action. - Await final report of recent imaging. If concerning findings are present, contact him. - Schedule follow-up in one year if imaging is stable.  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 Differential diagnosis includes COPD or pulmonary fibrosis. Requires evaluation by a pulmonologist for further assessment and management. - Refer to a pulmonologist for further evaluation and management of respiratory symptoms.   The patient was advised to call immediately if he has any other concerning symptoms in the interval. The patient voices understanding of current disease status and treatment options and is in agreement with the current care plan.  All questions were answered. The patient knows to call the clinic with any problems, questions or concerns. We can certainly see the patient much sooner if necessary.   Disclaimer: This note was dictated with voice recognition software. Similar sounding words can inadvertently be transcribed and may not be corrected upon review.

## 2023-10-04 ENCOUNTER — Encounter: Payer: Self-pay | Admitting: Pulmonary Disease

## 2023-10-04 ENCOUNTER — Ambulatory Visit: Admitting: Pulmonary Disease

## 2023-10-04 VITALS — BP 124/72 | HR 94 | Temp 97.1°F | Ht 70.0 in | Wt 189.4 lb

## 2023-10-04 DIAGNOSIS — Z85110 Personal history of malignant carcinoid tumor of bronchus and lung: Secondary | ICD-10-CM

## 2023-10-04 DIAGNOSIS — R0609 Other forms of dyspnea: Secondary | ICD-10-CM | POA: Diagnosis not present

## 2023-10-04 LAB — NITRIC OXIDE: Nitric Oxide: 13

## 2023-10-04 MED ORDER — ALBUTEROL SULFATE HFA 108 (90 BASE) MCG/ACT IN AERS
2.0000 | INHALATION_SPRAY | Freq: Four times a day (QID) | RESPIRATORY_TRACT | 2 refills | Status: AC | PRN
Start: 2023-10-04 — End: ?

## 2023-10-04 MED ORDER — WIXELA INHUB 250-50 MCG/ACT IN AEPB: 1.0000 | INHALATION_SPRAY | Freq: Two times a day (BID) | RESPIRATORY_TRACT | 12 refills | Status: DC

## 2023-10-04 NOTE — Progress Notes (Signed)
 Synopsis: Referred in by Si Gaul, MD   Subjective:   PATIENT ID: Bill [name] GENDER: male DOB: 1960-09-23, MRN: [id number]  Chief Complaint  Patient presents with   Consult    Lung Cancer for 2 years. DOE. Wheezing. Cough with grey sputum in the mornings.    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  He reports that when he is outside of work his symptoms resolved.  He does report intermittent chest pain but mostly it is the dyspnea and severe fatigue.  He denies any prior histories shortness of breath wheezing chest tightness or cough.  He did have a CT chest in March 2025 that showed subtle signs of pulmonary fibrosis in the right upper lobe, known right-sided pleural effusion with calcifications and pleural thickening.  Family history -no family history of pulmonary disease.  ROS All systems were reviewed and are negative except for the above. Objective:   Vitals:   10/04/23 1031  BP: 124/72  Pulse: 94  Temp: (!) 97.1 F (36.2 C)  SpO2: 99%  Weight: 189 lb 6.4 oz (85.9 kg)  Height: 5\' 10"  (1.778 m)   99% on RA BMI Readings from Last 3 Encounters:  10/04/23 27.18 kg/m  09/12/23 27.64 kg/m  09/06/22 26.77 kg/m   Wt Readings from Last 3 Encounters:  10/04/23 189 lb 6.4 oz (85.9 kg)  09/12/23 192 lb 9.6 oz (87.4 kg)  09/06/22 186 lb 9.6 oz (84.6 kg)    Physical Exam GEN: NAD, Healthy Appearing HEENT: Supple Neck, Reactive Pupils, EOMI   CVS: Normal S1, Normal S2, RRR, No murmurs or ES appreciated  Lungs: Diminished at the right base no wheezing appreciated Abdomen: Soft, non tender, non distended, + BS  Extremities: Warm and well perfused, No edema  Skin: No suspicious lesions appreciated  Psych: Normal Affect  Ancillary Information   CBC    Component Value Date/Time   WBC 8.0 09/12/2023 1021   WBC 8.6 04/03/2021 0130   RBC 5.73 09/12/2023 1021   HGB 15.9 09/12/2023 1021   HCT 48.7 09/12/2023 1021   PLT 180 09/12/2023 1021   MCV 85.0 09/12/2023 1021   MCH 27.7 09/12/2023 1021   MCHC 32.6 09/12/2023 1021   RDW 13.8 09/12/2023 1021   LYMPHSABS 2.1 09/12/2023 1021   MONOABS 0.7 09/12/2023 1021   EOSABS 0.3 09/12/2023 1021   BASOSABS 0.1 09/12/2023 1021   Labs and imaging were reviewed  Feno 13 indicating less likely to have eosinophilic inflammation in the lungs.     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  On weekends he feels better.  This is consistent with either occupational related asthma versus Welders lung disease specifically with the subtle fibrotic changes we are seeing on his CT scan or metal fume fever.  I will obtain pulmonary function test to assess pulmonary function.  I will start him on an ICS/LABA and assess response.  I have advised him to be consistent with wearing N95 plus mask while at work optimally a respirator.  Return in about 4 weeks (around 11/01/2023) for with PFTs .  I spent 60 minutes caring for this patient today, including preparing to see the patient, obtaining a  medical history , reviewing a separately obtained history, performing a medically appropriate examination and/or evaluation, counseling and educating the patient/family/caregiver, ordering medications, tests, or procedures, documenting clinical information in the electronic health record, and independently interpreting results (not separately reported/billed) and communicating results to the patient/family/caregiver  Janann Colonel, MD [facility] 10/04/2023 6:51 PM

## 2023-11-09 ENCOUNTER — Encounter

## 2023-11-09 ENCOUNTER — Ambulatory Visit: Admitting: Pulmonary Disease

## 2023-11-22 DIAGNOSIS — Z1211 Encounter for screening for malignant neoplasm of colon: Secondary | ICD-10-CM | POA: Diagnosis not present

## 2023-11-22 DIAGNOSIS — E1159 Type 2 diabetes mellitus with other circulatory complications: Secondary | ICD-10-CM | POA: Diagnosis not present

## 2023-11-22 DIAGNOSIS — M5136 Other intervertebral disc degeneration, lumbar region with discogenic back pain only: Secondary | ICD-10-CM | POA: Diagnosis not present

## 2023-11-22 DIAGNOSIS — M12812 Other specific arthropathies, not elsewhere classified, left shoulder: Secondary | ICD-10-CM | POA: Diagnosis not present

## 2023-11-22 DIAGNOSIS — Z23 Encounter for immunization: Secondary | ICD-10-CM | POA: Diagnosis not present

## 2023-11-22 DIAGNOSIS — R7989 Other specified abnormal findings of blood chemistry: Secondary | ICD-10-CM | POA: Diagnosis not present

## 2023-11-22 DIAGNOSIS — I2510 Atherosclerotic heart disease of native coronary artery without angina pectoris: Secondary | ICD-10-CM | POA: Diagnosis not present

## 2023-11-22 DIAGNOSIS — C3491 Malignant neoplasm of unspecified part of right bronchus or lung: Secondary | ICD-10-CM | POA: Diagnosis not present

## 2023-11-22 DIAGNOSIS — Z125 Encounter for screening for malignant neoplasm of prostate: Secondary | ICD-10-CM | POA: Diagnosis not present

## 2023-11-22 DIAGNOSIS — Z0000 Encounter for general adult medical examination without abnormal findings: Secondary | ICD-10-CM | POA: Diagnosis not present

## 2023-11-23 ENCOUNTER — Ambulatory Visit: Admitting: Pulmonary Disease

## 2023-11-23 ENCOUNTER — Encounter: Payer: Self-pay | Admitting: Pulmonary Disease

## 2023-11-23 VITALS — BP 118/70 | HR 85 | Temp 97.1°F | Ht 69.0 in | Wt 187.0 lb

## 2023-11-23 DIAGNOSIS — J4530 Mild persistent asthma, uncomplicated: Secondary | ICD-10-CM

## 2023-11-23 DIAGNOSIS — R0609 Other forms of dyspnea: Secondary | ICD-10-CM | POA: Diagnosis not present

## 2023-11-23 DIAGNOSIS — G4733 Obstructive sleep apnea (adult) (pediatric): Secondary | ICD-10-CM

## 2023-11-23 LAB — PULMONARY FUNCTION TEST
DL/VA % pred: 123 %
DL/VA: 5.14 ml/min/mmHg/L
DLCO unc % pred: 98 %
DLCO unc: 25.8 ml/min/mmHg
FEF 25-75 Post: 3.46 L/s
FEF 25-75 Pre: 2.46 L/s
FEF2575-%Change-Post: 40 %
FEF2575-%Pred-Post: 126 %
FEF2575-%Pred-Pre: 90 %
FEV1-%Change-Post: 11 %
FEV1-%Pred-Post: 90 %
FEV1-%Pred-Pre: 81 %
FEV1-Post: 3.06 L
FEV1-Pre: 2.74 L
FEV1FVC-%Change-Post: 2 %
FEV1FVC-%Pred-Pre: 103 %
FEV6-%Change-Post: 10 %
FEV6-%Pred-Post: 89 %
FEV6-%Pred-Pre: 80 %
FEV6-Post: 3.82 L
FEV6-Pre: 3.45 L
FEV6FVC-%Change-Post: 0 %
FEV6FVC-%Pred-Post: 104 %
FEV6FVC-%Pred-Pre: 105 %
FVC-%Change-Post: 8 %
FVC-%Pred-Post: 85 %
FVC-%Pred-Pre: 78 %
FVC-Post: 3.84 L
FVC-Pre: 3.53 L
Post FEV1/FVC ratio: 80 %
Post FEV6/FVC ratio: 100 %
Pre FEV1/FVC ratio: 78 %
Pre FEV6/FVC Ratio: 100 %
RV % pred: 126 %
RV: 2.84 L
TLC % pred: 94 %
TLC: 6.44 L

## 2023-11-23 MED ORDER — FLUTICASONE-SALMETEROL 250-50 MCG/ACT IN AEPB
1.0000 | INHALATION_SPRAY | Freq: Two times a day (BID) | RESPIRATORY_TRACT | 3 refills | Status: DC
Start: 2023-11-23 — End: 2024-04-03

## 2023-11-23 NOTE — Patient Instructions (Signed)
 Full PFT completed today ? ?

## 2023-11-23 NOTE — Progress Notes (Signed)
 Full PFT completed today ? ?

## 2023-11-23 NOTE — Progress Notes (Signed)
 Synopsis: Referred in by Annitta Kindler, MD   Subjective:   PATIENT ID: Bill [name] GENDER: male DOB: 1960/11/23, MRN: [id number]  Chief Complaint  Patient presents with   Follow-up    No SOB, wheezing or cough.  PFT done today.    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  He reports that when he is outside of work his symptoms resolved.  He does report intermittent chest pain but mostly it is the dyspnea and severe fatigue.  He denies any prior histories shortness of breath wheezing chest tightness or cough.  He did have a CT chest in March 2025 that showed subtle signs of pulmonary fibrosis in the right upper lobe, known right-sided pleural effusion with calcifications and pleural thickening.  Family history -no family history of pulmonary disease.  OV 11/23/2023 - I saw Bill [name] for the first time in April 2025 with complaints of shortness of breath with chest tightness and extreme fatigue. This was exacerbated by exposure to fumes at work from welding. Overall picture was consistent with asthma vs occupational asthma for which I started him on Wixela. Appears to be improving. PFTs today with normal spiro but significant response to bronchodilators. LV suggestive of air trapping and normal DLCO. Advised on conitnuing his ICS-LABA. Furthermore his wife reports loud snoring with apneic episodes and he does report fatigue during the day, we discussed obtaining home sleep  study and he is agreable.    ROS All systems were reviewed and are negative except for the above. Objective:   Vitals:   11/23/23 1355  BP: 118/70  Pulse: 85  Temp: (!) 97.1 F (36.2 C)  SpO2: 96%  Weight: 187 lb (84.8 kg)  Height: 5\' 9"  (1.753 m)   96% on RA BMI Readings from Last 3 Encounters:  11/23/23 27.62 kg/m  11/23/23 27.67 kg/m  10/04/23 27.18 kg/m   Wt Readings from Last 3 Encounters:  11/23/23 187 lb (84.8 kg)  11/23/23 187 lb 6.4 oz (85 kg)  10/04/23 189 lb 6.4 oz (85.9 kg)    Physical Exam GEN: NAD, Healthy Appearing HEENT: Supple Neck, Reactive Pupils, EOMI  CVS: Normal S1, Normal S2, RRR, No murmurs or ES appreciated  Lungs: Diminished at the right base no wheezing appreciated Abdomen: Soft, non tender, non distended, + BS  Extremities: Warm and well perfused, No edema  Skin: No suspicious lesions appreciated  Psych: Normal Affect  Ancillary Information   CBC    Component Value Date/Time   WBC 8.0 09/12/2023 1021   WBC 8.6 04/03/2021 0130   RBC 5.73 09/12/2023 1021   HGB 15.9 09/12/2023 1021   HCT 48.7 09/12/2023 1021   PLT 180 09/12/2023 1021   MCV 85.0 09/12/2023 1021   MCH 27.7 09/12/2023 1021   MCHC 32.6 09/12/2023 1021   RDW 13.8 09/12/2023 1021   LYMPHSABS 2.1 09/12/2023 1021   MONOABS 0.7 09/12/2023 1021   EOSABS 0.3 09/12/2023 1021   BASOSABS 0.1 09/12/2023 1021  Labs and imaging were reviewed  Feno 13 indicating less likely to have eosinophilic inflammation in the lungs.    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  On weekends he feels better.  This is consistent with either occupational related asthma versus Welders lung disease specifically with the subtle fibrotic changes we are seeing on his CT scan.  #Moderate persistent Asthma  FENO 13, EOS 400  PFTs with nl spiro, sig response to bdl, air trapping and normal DLCO.   []  C/w Fluticasone-Salmeterol [wixela] 250-50 1 puff bid. Advised on mouth rinsing.  []  C/w Albuterol  as needed.   #Suspecting OSA  Patient with loud snoring, witness apneas and fatigue during the day.   []  HST  RTC 4 months.   I spent 30 minutes caring for this patient today, including preparing to see the patient, obtaining a medical history , reviewing a separately obtained history, performing a medically appropriate examination and/or evaluation, counseling and educating the patient/family/caregiver, ordering medications, tests, or procedures, documenting clinical information in the electronic health record, and independently interpreting results (not separately reported/billed) and communicating results to the patient/family/caregiver  Annitta Kindler, MD [facility] 11/23/2023 2:00 PM

## 2023-12-10 ENCOUNTER — Encounter

## 2023-12-10 DIAGNOSIS — G4730 Sleep apnea, unspecified: Secondary | ICD-10-CM | POA: Diagnosis not present

## 2023-12-10 DIAGNOSIS — G4733 Obstructive sleep apnea (adult) (pediatric): Secondary | ICD-10-CM

## 2023-12-27 DIAGNOSIS — G4733 Obstructive sleep apnea (adult) (pediatric): Secondary | ICD-10-CM | POA: Diagnosis not present

## 2023-12-28 ENCOUNTER — Telehealth: Payer: Self-pay | Admitting: Pulmonary Disease

## 2023-12-28 DIAGNOSIS — G4733 Obstructive sleep apnea (adult) (pediatric): Secondary | ICD-10-CM

## 2023-12-28 NOTE — Telephone Encounter (Signed)
Results have been scanned into his chart.

## 2023-12-28 NOTE — Telephone Encounter (Signed)
 Tracie wife states patient would like results of home sleep test. Tracie phone number is [phone].

## 2024-01-03 NOTE — Telephone Encounter (Signed)
 I spoke with the patient's wife (DPR) and notified her of the sleep study results. She would like to go ahead with the CPAP. They will call back once they have received the machine for a 31-90 day compliance check appt.  Order has been placed.   Nothing further needed.

## 2024-01-12 DIAGNOSIS — G4733 Obstructive sleep apnea (adult) (pediatric): Secondary | ICD-10-CM | POA: Diagnosis not present

## 2024-02-07 DIAGNOSIS — G4733 Obstructive sleep apnea (adult) (pediatric): Secondary | ICD-10-CM | POA: Diagnosis not present

## 2024-02-12 DIAGNOSIS — G4733 Obstructive sleep apnea (adult) (pediatric): Secondary | ICD-10-CM | POA: Diagnosis not present

## 2024-02-17 DIAGNOSIS — M7502 Adhesive capsulitis of left shoulder: Secondary | ICD-10-CM | POA: Diagnosis not present

## 2024-02-17 DIAGNOSIS — G4733 Obstructive sleep apnea (adult) (pediatric): Secondary | ICD-10-CM | POA: Diagnosis not present

## 2024-02-17 DIAGNOSIS — R5383 Other fatigue: Secondary | ICD-10-CM | POA: Diagnosis not present

## 2024-02-17 DIAGNOSIS — R0609 Other forms of dyspnea: Secondary | ICD-10-CM | POA: Diagnosis not present

## 2024-03-01 DIAGNOSIS — E119 Type 2 diabetes mellitus without complications: Secondary | ICD-10-CM | POA: Diagnosis not present

## 2024-03-01 DIAGNOSIS — M75122 Complete rotator cuff tear or rupture of left shoulder, not specified as traumatic: Secondary | ICD-10-CM | POA: Diagnosis not present

## 2024-03-12 DIAGNOSIS — M75122 Complete rotator cuff tear or rupture of left shoulder, not specified as traumatic: Secondary | ICD-10-CM | POA: Diagnosis not present

## 2024-03-12 DIAGNOSIS — Z01818 Encounter for other preprocedural examination: Secondary | ICD-10-CM | POA: Diagnosis not present

## 2024-03-14 DIAGNOSIS — G4733 Obstructive sleep apnea (adult) (pediatric): Secondary | ICD-10-CM | POA: Diagnosis not present

## 2024-03-20 DIAGNOSIS — M75102 Unspecified rotator cuff tear or rupture of left shoulder, not specified as traumatic: Secondary | ICD-10-CM | POA: Diagnosis not present

## 2024-04-02 ENCOUNTER — Encounter: Payer: Self-pay | Admitting: Pulmonary Disease

## 2024-04-02 ENCOUNTER — Ambulatory Visit: Admitting: Pulmonary Disease

## 2024-04-03 ENCOUNTER — Encounter: Payer: Self-pay | Admitting: Pulmonary Disease

## 2024-04-03 ENCOUNTER — Ambulatory Visit: Admitting: Pulmonary Disease

## 2024-04-03 VITALS — BP 120/68 | HR 84 | Temp 97.5°F | Ht 69.0 in | Wt 192.6 lb

## 2024-04-03 DIAGNOSIS — J4550 Severe persistent asthma, uncomplicated: Secondary | ICD-10-CM

## 2024-04-03 DIAGNOSIS — J4540 Moderate persistent asthma, uncomplicated: Secondary | ICD-10-CM

## 2024-04-03 DIAGNOSIS — Z85118 Personal history of other malignant neoplasm of bronchus and lung: Secondary | ICD-10-CM

## 2024-04-03 DIAGNOSIS — G4733 Obstructive sleep apnea (adult) (pediatric): Secondary | ICD-10-CM | POA: Diagnosis not present

## 2024-04-03 MED ORDER — TRELEGY ELLIPTA 200-62.5-25 MCG/ACT IN AEPB
1.0000 | INHALATION_SPRAY | Freq: Every day | RESPIRATORY_TRACT | 7 refills | Status: AC
Start: 2024-04-03 — End: ?

## 2024-04-03 NOTE — Progress Notes (Signed)
 Synopsis: Referred in by Rosan Mix, MD   Subjective:   PATIENT ID: Bill [name] GENDER: male DOB: [birth year], MRN: [id number]  Chief Complaint  Patient presents with   Medical Management of Chronic Issues    DOE. No wheezing or cough. Sore throat for a month. Wixela 1 puff daily. Forgets to use it at night. Albuterol  as needed.  CPAP - has not been using the CPAP. Having problems with the mask.     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  He reports that when he is outside of work his symptoms resolved.  He does report intermittent chest pain but mostly it is the dyspnea and severe fatigue.  He denies any prior histories shortness of breath wheezing chest tightness or cough.  He did have a CT chest in March 2025 that showed subtle signs of pulmonary fibrosis in the right upper lobe, known right-sided pleural effusion with calcifications and pleural thickening.  Family history -no family history of pulmonary disease.  OV 11/23/2023 - I saw Bill [name] for the first time in April 2025 with complaints of shortness of breath with chest tightness and extreme fatigue. This was exacerbated by exposure to fumes at work from welding. Overall picture was consistent with asthma vs occupational asthma for which I started him on Wixela. Appears to be improving. PFTs today with normal spiro but significant response to bronchodilators. LV suggestive of air trapping and normal DLCO.  Advised on conitnuing his ICS-LABA. Furthermore his wife reports loud snoring with apneic episodes and he does report fatigue during the day, we discussed obtaining home sleep study and he is agreable.    OV 10.7.2025 - Bill [name] is here to follow up re his moderate persistent asthma and OSA. He is only taking Wixela once a day as it makes him jittery at night. He is not wearing his CPAP as feels that the mask is not staying on. Discussed switching inhalers to Trelegy ellipta for improved compliance. He is agreeable. Discussed timing his Tramadol  prior to bedtime and he will try that.   ROS All systems were reviewed and are negative except for the above. Objective:   Vitals:   04/03/24 1403  BP: 120/68  Pulse: 84  Temp: (!) 97.5 F (36.4 C)  SpO2: 97%  Weight: 192 lb 9.6 oz (87.4 kg)  Height: 5' 9 (1.753 m)   97% on RA BMI Readings from Last 3 Encounters:  04/03/24 28.44 kg/m  11/23/23 27.62 kg/m  11/23/23 27.67 kg/m   Wt Readings from Last 3 Encounters:  04/03/24 192 lb 9.6 oz (87.4 kg)  11/23/23 187 lb (84.8 kg)  11/23/23 187 lb 6.4 oz (85 kg)    Physical Exam GEN: NAD, Healthy Appearing HEENT: Supple Neck, Reactive Pupils, EOMI  CVS: Normal S1, Normal S2, RRR, No murmurs or ES appreciated  Lungs: Diminished at the right base no wheezing appreciated Abdomen: Soft, non tender, non distended, + BS  Extremities: Warm and well perfused, No edema  Skin: No suspicious lesions appreciated  Psych: Normal Affect  Ancillary Information   CBC    Component Value Date/Time   WBC 8.0 09/12/2023 1021   WBC 8.6 04/03/2021 0130   RBC 5.73 09/12/2023 1021   HGB 15.9 09/12/2023 1021   HCT 48.7 09/12/2023 1021   PLT 180 09/12/2023 1021   MCV 85.0 09/12/2023 1021   MCH 27.7 09/12/2023 1021   MCHC 32.6 09/12/2023 1021   RDW 13.8 09/12/2023 1021   LYMPHSABS 2.1 09/12/2023 1021   MONOABS 0.7 09/12/2023 1021   EOSABS 0.3 09/12/2023 1021   BASOSABS 0.1 09/12/2023 1021   Labs  and imaging were reviewed  Feno 13 indicating less likely to have eosinophilic inflammation in the lungs.    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  On weekends he feels better.  This is consistent with either occupational related asthma versus Welders lung disease specifically with the subtle fibrotic changes we are seeing on his CT scan.  #Moderate persistent Asthma  FENO 13, EOS 400  PFTs with nl spiro, sig response to bdl, air trapping and normal DLCO.   []  Switch to Trelegy Ellipta 200 1 puff daily.  []  C/w Albuterol  as needed.   #Moderate OSA with AHI 28 11/2023.    []  c/w auto CPAP 5-15 cmH2O.   RTC 6 months.   I spent 40 minutes caring for this patient today, including preparing to see the patient, obtaining a medical history , reviewing a separately obtained history, performing a medically appropriate examination and/or evaluation, counseling and educating the patient/family/caregiver, ordering medications, tests, or procedures, documenting clinical information in the electronic health record, and independently interpreting results (not separately reported/billed) and communicating results to the  patient/family/caregiver  Darrin Barn, MD [facility] 04/03/2024 2:38 PM

## 2024-04-17 ENCOUNTER — Other Ambulatory Visit (HOSPITAL_BASED_OUTPATIENT_CLINIC_OR_DEPARTMENT_OTHER)

## 2024-04-30 ENCOUNTER — Other Ambulatory Visit (HOSPITAL_BASED_OUTPATIENT_CLINIC_OR_DEPARTMENT_OTHER)

## 2024-07-18 ENCOUNTER — Other Ambulatory Visit: Payer: Self-pay

## 2024-07-18 ENCOUNTER — Telehealth: Payer: Self-pay

## 2024-07-18 DIAGNOSIS — C3490 Malignant neoplasm of unspecified part of unspecified bronchus or lung: Secondary | ICD-10-CM

## 2024-07-18 NOTE — Telephone Encounter (Signed)
 Spoke with patients wife, Tracie, regarding CT scan order. She reported that she contacted scheduling to arrange the CT scan for 09/19/24 to coincide with the patients lab work and appointment with Dr. Sherrod. She was informed by CT scheduling that the current order is set to expire on 09/01/24 and that a new order is required.  [name] stated that they live out of town and prefer to complete labs, CT scan, and the visit with Dr. Sherrod on the same day.  A new order was placed for a CT scan of the chest. She voiced understanding.

## 2024-09-18 ENCOUNTER — Other Ambulatory Visit

## 2024-09-18 ENCOUNTER — Ambulatory Visit: Admitting: Internal Medicine

## 2024-09-18 ENCOUNTER — Ambulatory Visit (HOSPITAL_COMMUNITY)
